# Patient Record
Sex: Female | Born: 1935 | Race: White | Hispanic: No | Smoking: Former smoker
Health system: Southern US, Community
[De-identification: ages and names within clinical notes are randomized; demographics above are authoritative.]

## PROBLEM LIST (undated history)

## (undated) DIAGNOSIS — M1710 Unilateral primary osteoarthritis, unspecified knee: Secondary | ICD-10-CM

## (undated) DIAGNOSIS — I2510 Atherosclerotic heart disease of native coronary artery without angina pectoris: Secondary | ICD-10-CM

## (undated) DIAGNOSIS — G8929 Other chronic pain: Secondary | ICD-10-CM

## (undated) DIAGNOSIS — E782 Mixed hyperlipidemia: Secondary | ICD-10-CM

## (undated) DIAGNOSIS — E119 Type 2 diabetes mellitus without complications: Secondary | ICD-10-CM

## (undated) DIAGNOSIS — R7303 Prediabetes: Secondary | ICD-10-CM

## (undated) DIAGNOSIS — I10 Essential (primary) hypertension: Secondary | ICD-10-CM

## (undated) DIAGNOSIS — M542 Cervicalgia: Secondary | ICD-10-CM

## (undated) DIAGNOSIS — K529 Noninfective gastroenteritis and colitis, unspecified: Secondary | ICD-10-CM

## (undated) DIAGNOSIS — E039 Hypothyroidism, unspecified: Secondary | ICD-10-CM

## (undated) HISTORY — DX: Prediabetes: R73.03

## (undated) HISTORY — DX: Essential (primary) hypertension: I10

## (undated) HISTORY — DX: Noninfective gastroenteritis and colitis, unspecified: K52.9

## (undated) HISTORY — DX: Hypothyroidism, unspecified: E03.9

## (undated) HISTORY — PX: CORONARY ARTERY BYPASS GRAFT: SHX141

## (undated) HISTORY — DX: Unilateral primary osteoarthritis, unspecified knee: M17.10

## (undated) HISTORY — DX: Other chronic pain: G89.29

## (undated) HISTORY — DX: Atherosclerotic heart disease of native coronary artery without angina pectoris: I25.10

## (undated) HISTORY — PX: KNEE ARTHROSCOPY: SUR90

## (undated) HISTORY — DX: Cervicalgia: M54.2

## (undated) HISTORY — DX: Mixed hyperlipidemia: E78.2

---

## 1990-11-01 HISTORY — PX: CERVICAL FUSION: SHX112

## 2014-09-24 DIAGNOSIS — K529 Noninfective gastroenteritis and colitis, unspecified: Secondary | ICD-10-CM | POA: Insufficient documentation

## 2014-09-24 HISTORY — DX: Noninfective gastroenteritis and colitis, unspecified: K52.9

## 2014-10-19 ENCOUNTER — Emergency Department: Payer: Self-pay | Admitting: Emergency Medicine

## 2014-10-19 LAB — URINALYSIS, COMPLETE
BILIRUBIN, UR: NEGATIVE
Blood: NEGATIVE
Glucose,UR: NEGATIVE mg/dL (ref 0–75)
Ketone: NEGATIVE
Nitrite: POSITIVE
Ph: 5 (ref 4.5–8.0)
RBC,UR: 41 /HPF (ref 0–5)
SPECIFIC GRAVITY: 1.023 (ref 1.003–1.030)
Squamous Epithelial: NONE SEEN
WBC UR: 1663 /HPF (ref 0–5)

## 2014-10-19 LAB — COMPREHENSIVE METABOLIC PANEL
ALT: 34 U/L
Albumin: 3.3 g/dL — ABNORMAL LOW (ref 3.4–5.0)
Alkaline Phosphatase: 69 U/L
Anion Gap: 8 (ref 7–16)
BUN: 18 mg/dL (ref 7–18)
Bilirubin,Total: 0.5 mg/dL (ref 0.2–1.0)
CALCIUM: 8.6 mg/dL (ref 8.5–10.1)
CREATININE: 1.29 mg/dL (ref 0.60–1.30)
Chloride: 109 mmol/L — ABNORMAL HIGH (ref 98–107)
Co2: 25 mmol/L (ref 21–32)
EGFR (African American): 51 — ABNORMAL LOW
EGFR (Non-African Amer.): 42 — ABNORMAL LOW
Glucose: 91 mg/dL (ref 65–99)
Osmolality: 285 (ref 275–301)
Potassium: 4.1 mmol/L (ref 3.5–5.1)
SGOT(AST): 37 U/L (ref 15–37)
SODIUM: 142 mmol/L (ref 136–145)
Total Protein: 7.6 g/dL (ref 6.4–8.2)

## 2014-10-19 LAB — CBC WITH DIFFERENTIAL/PLATELET
BASOS ABS: 0.1 10*3/uL (ref 0.0–0.1)
Basophil %: 0.6 %
EOS PCT: 2.2 %
Eosinophil #: 0.2 10*3/uL (ref 0.0–0.7)
HCT: 40.2 % (ref 35.0–47.0)
HGB: 12.7 g/dL (ref 12.0–16.0)
LYMPHS ABS: 3.1 10*3/uL (ref 1.0–3.6)
Lymphocyte %: 31.1 %
MCH: 28.6 pg (ref 26.0–34.0)
MCHC: 31.6 g/dL — AB (ref 32.0–36.0)
MCV: 90 fL (ref 80–100)
Monocyte #: 1 x10 3/mm — ABNORMAL HIGH (ref 0.2–0.9)
Monocyte %: 9.6 %
NEUTROS ABS: 5.7 10*3/uL (ref 1.4–6.5)
Neutrophil %: 56.5 %
Platelet: 221 10*3/uL (ref 150–440)
RBC: 4.44 10*6/uL (ref 3.80–5.20)
RDW: 14.9 % — ABNORMAL HIGH (ref 11.5–14.5)
WBC: 10.1 10*3/uL (ref 3.6–11.0)

## 2014-10-22 LAB — URINE CULTURE

## 2014-10-24 DIAGNOSIS — I10 Essential (primary) hypertension: Secondary | ICD-10-CM

## 2014-10-24 DIAGNOSIS — E782 Mixed hyperlipidemia: Secondary | ICD-10-CM

## 2014-10-24 HISTORY — DX: Mixed hyperlipidemia: E78.2

## 2014-10-24 HISTORY — DX: Essential (primary) hypertension: I10

## 2014-10-29 ENCOUNTER — Ambulatory Visit: Payer: Self-pay | Admitting: Gastroenterology

## 2014-10-29 LAB — CLOSTRIDIUM DIFFICILE(ARMC)

## 2014-11-01 LAB — STOOL CULTURE

## 2015-04-10 DIAGNOSIS — M1710 Unilateral primary osteoarthritis, unspecified knee: Secondary | ICD-10-CM | POA: Insufficient documentation

## 2015-04-10 DIAGNOSIS — M179 Osteoarthritis of knee, unspecified: Secondary | ICD-10-CM | POA: Insufficient documentation

## 2015-04-10 HISTORY — DX: Osteoarthritis of knee, unspecified: M17.9

## 2015-04-10 HISTORY — DX: Unilateral primary osteoarthritis, unspecified knee: M17.10

## 2015-07-21 ENCOUNTER — Other Ambulatory Visit: Payer: Self-pay | Admitting: Unknown Physician Specialty

## 2015-07-21 DIAGNOSIS — M1711 Unilateral primary osteoarthritis, right knee: Secondary | ICD-10-CM

## 2015-07-31 ENCOUNTER — Ambulatory Visit
Admission: RE | Admit: 2015-07-31 | Discharge: 2015-07-31 | Disposition: A | Discharge status: Home / Self Care | Payer: Medicare Other | Source: Ambulatory Visit | Attending: Unknown Physician Specialty | Admitting: Unknown Physician Specialty

## 2015-07-31 DIAGNOSIS — S83281A Other tear of lateral meniscus, current injury, right knee, initial encounter: Secondary | ICD-10-CM | POA: Diagnosis not present

## 2015-07-31 DIAGNOSIS — X58XXXA Exposure to other specified factors, initial encounter: Secondary | ICD-10-CM | POA: Insufficient documentation

## 2015-07-31 DIAGNOSIS — M25461 Effusion, right knee: Secondary | ICD-10-CM | POA: Diagnosis not present

## 2015-07-31 DIAGNOSIS — M1711 Unilateral primary osteoarthritis, right knee: Secondary | ICD-10-CM

## 2015-07-31 DIAGNOSIS — M7121 Synovial cyst of popliteal space [Baker], right knee: Secondary | ICD-10-CM | POA: Insufficient documentation

## 2015-08-18 ENCOUNTER — Other Ambulatory Visit: Payer: Self-pay | Admitting: Physician Assistant

## 2015-08-18 ENCOUNTER — Ambulatory Visit: Payer: Medicare Other | Admitting: Family Medicine

## 2015-08-18 ENCOUNTER — Ambulatory Visit
Admission: RE | Admit: 2015-08-18 | Discharge: 2015-08-18 | Disposition: A | Discharge status: Home / Self Care | Payer: Medicare Other | Source: Ambulatory Visit | Attending: Physician Assistant | Admitting: Physician Assistant

## 2015-08-18 DIAGNOSIS — M7989 Other specified soft tissue disorders: Secondary | ICD-10-CM

## 2015-08-18 DIAGNOSIS — M79662 Pain in left lower leg: Secondary | ICD-10-CM | POA: Diagnosis not present

## 2015-08-18 DIAGNOSIS — M79661 Pain in right lower leg: Secondary | ICD-10-CM

## 2015-08-25 ENCOUNTER — Encounter: Payer: Self-pay | Admitting: *Deleted

## 2015-08-25 DIAGNOSIS — I2510 Atherosclerotic heart disease of native coronary artery without angina pectoris: Secondary | ICD-10-CM

## 2015-08-25 DIAGNOSIS — R7303 Prediabetes: Secondary | ICD-10-CM

## 2015-08-25 DIAGNOSIS — G8929 Other chronic pain: Secondary | ICD-10-CM | POA: Insufficient documentation

## 2015-08-25 DIAGNOSIS — E039 Hypothyroidism, unspecified: Secondary | ICD-10-CM

## 2015-08-25 DIAGNOSIS — M542 Cervicalgia: Secondary | ICD-10-CM

## 2015-08-25 HISTORY — DX: Atherosclerotic heart disease of native coronary artery without angina pectoris: I25.10

## 2015-08-25 HISTORY — DX: Hypothyroidism, unspecified: E03.9

## 2015-08-25 HISTORY — DX: Prediabetes: R73.03

## 2015-08-25 HISTORY — DX: Other chronic pain: G89.29

## 2015-08-26 ENCOUNTER — Encounter: Payer: Self-pay | Admitting: Obstetrics and Gynecology

## 2015-08-26 ENCOUNTER — Ambulatory Visit (INDEPENDENT_AMBULATORY_CARE_PROVIDER_SITE_OTHER): Payer: Medicare Other | Admitting: Obstetrics and Gynecology

## 2015-08-26 VITALS — BP 112/70 | HR 92 | Resp 16 | Ht 65.0 in | Wt 216.6 lb

## 2015-08-26 DIAGNOSIS — R32 Unspecified urinary incontinence: Secondary | ICD-10-CM

## 2015-08-26 LAB — URINALYSIS, COMPLETE
Bilirubin, UA: NEGATIVE
Glucose, UA: NEGATIVE
Ketones, UA: NEGATIVE
NITRITE UA: NEGATIVE
PH UA: 5.5 (ref 5.0–7.5)
Specific Gravity, UA: 1.015 (ref 1.005–1.030)
Urobilinogen, Ur: 0.2 mg/dL (ref 0.2–1.0)

## 2015-08-26 LAB — MICROSCOPIC EXAMINATION
EPITHELIAL CELLS (NON RENAL): NONE SEEN /HPF (ref 0–10)
RENAL EPITHEL UA: NONE SEEN /HPF
WBC, UA: >30 /hpf — AB (ref 0–?)

## 2015-08-26 LAB — BLADDER SCAN AMB NON-IMAGING: Scan Result: 85

## 2015-08-26 MED ORDER — ESTROGENS, CONJUGATED 0.625 MG/GM VA CREA: TOPICAL_CREAM | VAGINAL | Status: AC

## 2015-08-26 NOTE — Progress Notes (Signed)
08/26/2015 1:13 PM   Claire Cortez January 21, 1934 [id number]  Referring provider: Marisue Ivan, MD [phone] [facility] [address] [facility] [place], [place] [address]  Chief Complaint  Patient presents with  . Urinary Incontinence  . Establish Care    HPI: Patient is a 81 year old female presenting today with complaints of weak stream, frequent small volume voids, nocturia 4 times per night, sensation of incomplete bladder emptying and urge incontinence. Symptoms have been ongoing for the last 5 years. She also reports recurrent urinary tract infections. No dysuria or hematuria.  Does reports symptoms of vaginal dryness and odor.   No previous GU/Gyn surgeries.   H2O intake 40ozs daily. 1 cup decaf coffee per day, 1 soda per day.   PMH: Past Medical History  Diagnosis Date  . Arteriosclerosis of coronary artery 08/25/2015    Overview:  Sp cabg x3 with graft stent 2007   . Benign essential HTN 10/24/2014  . Chronic diarrhea 09/24/2014  . Acquired hypothyroidism 08/25/2015  . Borderline diabetes mellitus 08/25/2015  . Arthritis of knee, degenerative 04/10/2015  . Chronic cervical pain 08/25/2015  . Combined fat and carbohydrate induced hyperlipemia 10/24/2014    Surgical History: Past Surgical History  Procedure Laterality Date  . Cervical fusion  1992  . Knee arthroscopy    . Coronary artery bypass graft      x3    Home Medications:    Medication List       This list is accurate as of: 08/26/15  1:13 PM.  Always use your most recent med list.               amitriptyline 25 MG tablet  Commonly known as:  ELAVIL  Take by mouth.     aspirin EC 81 MG tablet  Take by mouth.     CO ENZYME Q-10 PO  Take 200 mg by mouth.     conjugated estrogens vaginal cream  Commonly known as:  PREMARIN  Apply blueberry sized amount to vaginal opening with finger tip M-W-F nights.     CRESTOR 10 MG tablet  Generic drug:  rosuvastatin  TAKE 1 TABLET NIGHTLY     Fish Oil 1200 MG Caps  Take by mouth.     levothyroxine 88 MCG tablet  Commonly known as:  SYNTHROID, LEVOTHROID  TAKE 1 TABLET DAILY ON AN EMPTY STOMACH WITH A GLASS OF WATER AT LEAST 30 TO 60 MINUTES BEFORE BREAKFAST     lisinopril 10 MG tablet  Commonly known as:  PRINIVIL,ZESTRIL  TAKE 1 TABLET DAILY     MULTI-VITAMINS Tabs  Take by mouth.     SM GLUCOSAMINE HCL 1500 MG Tabs  Generic drug:  Glucosamine HCl  Take by mouth.     traMADol 50 MG tablet  Commonly known as:  ULTRAM  Take by mouth.        Allergies:  Allergies  Allergen Reactions  . Emetrol Nausea And Vomiting  . Lopressor  [Metoprolol Tartrate]     Other reaction(s): Unknown intolerance  . Nsaids     Other reaction(s): Other (See Comments) Causes elevation in BP  . Sulfa Antibiotics Rash    Family History: Family History  Problem Relation Age of Onset  . Kidney cancer Neg Hx   . Kidney disease Neg Hx   . Prostate cancer Neg Hx     Social History:  reports that she quit smoking about 41 years ago. Her smoking use included Cigarettes. She does not have any  smokeless tobacco history on file. She reports that she drinks about 3.6 oz of alcohol per week. She reports that she does not use illicit drugs.  ROS: UROLOGY Frequent Urination?: No Hard to postpone urination?: Yes Burning/pain with urination?: No Get up at night to urinate?: Yes Leakage of urine?: Yes Urine stream starts and stops?: No Trouble starting stream?: No Do you have to strain to urinate?: No Blood in urine?: No Urinary tract infection?: Yes Sexually transmitted disease?: No Injury to kidneys or bladder?: No Painful intercourse?: No Weak stream?: No Currently pregnant?: No Vaginal bleeding?: No Last menstrual period?: 1983  Gastrointestinal Nausea?: No Vomiting?: No Indigestion/heartburn?: No Diarrhea?: No Constipation?: No  Constitutional Fever: No Night sweats?: No Weight loss?: No Fatigue?: Yes  Skin Skin  rash/lesions?: Yes Itching?: Yes  Eyes Blurred vision?: Yes Double vision?: No  Ears/Nose/Throat Sore throat?: No Sinus problems?: No  Hematologic/Lymphatic Swollen glands?: No Easy bruising?: Yes  Cardiovascular Leg swelling?: Yes Chest pain?: No  Respiratory Cough?: Yes Shortness of breath?: Yes  Endocrine Excessive thirst?: No  Musculoskeletal Back pain?: No Joint pain?: Yes  Neurological Headaches?: No Dizziness?: Yes  Psychologic Depression?: No Anxiety?: No  Physical Exam: BP 112/70 mmHg  Pulse 92  Resp 16  Ht 5\' 5"  (1.651 m)  Wt 216 lb 9.6 oz (98.249 kg)  BMI 36.04 kg/m2  Constitutional:  Alert and oriented, No acute distress. HEENT: [place] AT, moist mucus membranes.  Trachea midline, no masses. Cardiovascular: No clubbing, cyanosis, or edema. Respiratory: Normal respiratory effort, no increased work of breathing. GI: Abdomen is soft, nontender, nondistended, no abdominal masses GU: No CVA tenderness.  Pelvic: External:no rashes or lesions, atrophic vaginal mucosa, no cystocele or SUI with valsalva Skin: No rashes, bruises or suspicious lesions. Lymph: No cervical or inguinal adenopathy. Neurologic: Grossly intact, no focal deficits, moving all 4 extremities. Psychiatric: Normal mood and affect.  Laboratory Data:   Urinalysis Results for orders placed or performed in visit on 08/26/15  BLADDER SCAN AMB NON-IMAGING  Result Value Ref Range   Scan Result 85 mL     Pertinent Imaging:  Assessment & Plan:   1. OAB- PVR 85mL. Two weeks of samples provided of Vesicare 5mg .   -RTC for recheck and PVR. - Urinalysis, Complete  2. Vaginal Atrophy- Prescription written for Premarin vaginal estrogen cream.  3. Recurrent UTI-  UTI prevention strategies discussed.  Good perineal hygiene reviewed. Patient is encouraged to increase daily water intake, start cranberry supplements to prevent invasive colonization along the urinary tract and probiotics,  especially lactobacillus to restore normal vaginal flora.  UA suspicious for infection.  Sent for culture. Will treat pending culture results.   Return in about 2 weeks (around 09/09/2015) for recheck incontinence/PVR.  These notes generated with voice recognition software. I apologize for typographical errors.  Earlie LouLindsay Phillis Thackeray, FNP  [facility] [address], [address] [place], [place] [address] [phone]

## 2015-08-26 NOTE — Patient Instructions (Signed)

## 2015-08-28 LAB — CULTURE, URINE COMPREHENSIVE

## 2015-08-29 ENCOUNTER — Telehealth: Payer: Self-pay

## 2015-08-29 NOTE — Telephone Encounter (Signed)
Spoke with pt in reference to ucx and micro heme. Pt voiced understanding.

## 2015-08-29 NOTE — Telephone Encounter (Signed)
-----   Message from Fernanda DrumLindsay C Overton, FNP sent at 08/29/2015  3:41 PM EDT ----- Please notify patient that her urine culture was negative for infection. Her UA at her last visit did however show microscopic hematuria. We need to recheck this at her follow-up visit and possibly pursue further workup. I will talk to her about this further at her follow-up visit. Thanks

## 2015-09-09 ENCOUNTER — Ambulatory Visit (INDEPENDENT_AMBULATORY_CARE_PROVIDER_SITE_OTHER): Payer: Medicare Other | Admitting: Obstetrics and Gynecology

## 2015-09-09 ENCOUNTER — Encounter: Payer: Self-pay | Admitting: Obstetrics and Gynecology

## 2015-09-09 VITALS — BP 113/74 | HR 94 | Resp 18 | Ht 65.0 in | Wt 216.8 lb

## 2015-09-09 DIAGNOSIS — R32 Unspecified urinary incontinence: Secondary | ICD-10-CM | POA: Diagnosis not present

## 2015-09-09 LAB — BLADDER SCAN AMB NON-IMAGING

## 2015-09-09 LAB — URINALYSIS, COMPLETE
Bilirubin, UA: NEGATIVE
GLUCOSE, UA: NEGATIVE
KETONES UA: NEGATIVE
NITRITE UA: NEGATIVE
SPEC GRAV UA: 1.02 (ref 1.005–1.030)
UUROB: 0.2 mg/dL (ref 0.2–1.0)
pH, UA: 5.5 (ref 5.0–7.5)

## 2015-09-09 LAB — MICROSCOPIC EXAMINATION: RBC MICROSCOPIC, UA: NONE SEEN /HPF (ref 0–?)

## 2015-09-09 MED ORDER — SOLIFENACIN SUCCINATE 5 MG PO TABS: 5.0000 mg | ORAL_TABLET | Freq: Every day | ORAL | Status: AC

## 2015-09-09 NOTE — Progress Notes (Signed)
09/09/2015 11:00 AM   Claire Cortez [birth year] [id number]  Referring provider: Marisue Ivan, MD [phone] [facility] [address] [facility] [place], [place] [address]  No chief complaint on file.   HPI: Patient is a 81 year old female presenting today with complaints of weak stream, frequent small volume voids, nocturia 4 times per night, sensation of incomplete bladder emptying and urge incontinence. Symptoms have been ongoing for the last 5 years. She also reports recurrent urinary tract infections. No dysuria or hematuria.  Does reports symptoms of vaginal dryness and odor.   No previous GU/Gyn surgeries.   H2O intake 40ozs daily. 1 cup decaf coffee per day, 1 soda per day.  Interval History: Patient reports improvement improvement in symptoms.  She states she is experiencing less frequency and volume of each void has increased.  She feels that she is able to completely emptying her bladder.  She has not started her vaginal estrogen cream because she is concerned about potential side effects. She has experienced some dry mouth but is not extremely bothered by this. She would like to continue the Vesicare for now.   PMH: Past Medical History  Diagnosis Date  . Arteriosclerosis of coronary artery 08/25/2015    Overview:  Sp cabg x3 with graft stent 2007   . Benign essential HTN 10/24/2014  . Chronic diarrhea 09/24/2014  . Acquired hypothyroidism 08/25/2015  . Borderline diabetes mellitus 08/25/2015  . Arthritis of knee, degenerative 04/10/2015  . Chronic cervical pain 08/25/2015  . Combined fat and carbohydrate induced hyperlipemia 10/24/2014    Surgical History: Past Surgical History  Procedure Laterality Date  . Cervical fusion  1992  . Knee arthroscopy    . Coronary artery bypass graft      x3    Home Medications:    Medication List       This list is accurate as of: 09/09/15 11:00 AM.  Always use your most recent med list.               amitriptyline  25 MG tablet  Commonly known as:  ELAVIL  Take by mouth.     aspirin EC 81 MG tablet  Take by mouth.     CO ENZYME Q-10 PO  Take 200 mg by mouth.     conjugated estrogens vaginal cream  Commonly known as:  PREMARIN  Apply blueberry sized amount to vaginal opening with finger tip M-W-F nights.     CRESTOR 10 MG tablet  Generic drug:  rosuvastatin  TAKE 1 TABLET NIGHTLY     Fish Oil 1200 MG Caps  Take by mouth.     levothyroxine 88 MCG tablet  Commonly known as:  SYNTHROID, LEVOTHROID  TAKE 1 TABLET DAILY ON AN EMPTY STOMACH WITH A GLASS OF WATER AT LEAST 30 TO 60 MINUTES BEFORE BREAKFAST     lisinopril 10 MG tablet  Commonly known as:  PRINIVIL,ZESTRIL  TAKE 1 TABLET DAILY     MULTI-VITAMINS Tabs  Take by mouth.     SM GLUCOSAMINE HCL 1500 MG Tabs  Generic drug:  Glucosamine HCl  Take by mouth.     solifenacin 5 MG tablet  Commonly known as:  VESICARE  Take 1 tablet (5 mg total) by mouth daily.     traMADol 50 MG tablet  Commonly known as:  ULTRAM  Take by mouth.        Allergies:  Allergies  Allergen Reactions  . Emetrol Nausea And Vomiting  . Lopressor  [Metoprolol  Tartrate]     Other reaction(s): Unknown intolerance  . Nsaids     Other reaction(s): Other (See Comments) Causes elevation in BP  . Sulfa Antibiotics Rash    Family History: Family History  Problem Relation Age of Onset  . Kidney cancer Neg Hx   . Kidney disease Neg Hx   . Prostate cancer Neg Hx     Social History:  reports that she quit smoking about 41 years ago. Her smoking use included Cigarettes. She does not have any smokeless tobacco history on file. She reports that she drinks about 3.6 oz of alcohol per week. She reports that she does not use illicit drugs.  ROS: UROLOGY Frequent Urination?: Yes Hard to postpone urination?: Yes Burning/pain with urination?: No Get up at night to urinate?: Yes Leakage of urine?: Yes Urine stream starts and stops?: No Trouble starting  stream?: No Do you have to strain to urinate?: No Blood in urine?: No Urinary tract infection?: No Sexually transmitted disease?: No Injury to kidneys or bladder?: No Painful intercourse?: No Weak stream?: Yes Currently pregnant?: No Vaginal bleeding?: No Last menstrual period?: n  Gastrointestinal Nausea?: No Vomiting?: No Indigestion/heartburn?: No Diarrhea?: No Constipation?: No  Constitutional Fever: No Night sweats?: No Weight loss?: No Fatigue?: No  Skin Skin rash/lesions?: Yes Itching?: No  Eyes Blurred vision?: No Double vision?: No  Ears/Nose/Throat Sore throat?: No Sinus problems?: No  Hematologic/Lymphatic Swollen glands?: No Easy bruising?: Yes  Cardiovascular Leg swelling?: Yes Chest pain?: No  Respiratory Cough?: Yes Shortness of breath?: Yes  Endocrine Excessive thirst?: No  Musculoskeletal Back pain?: No Joint pain?: Yes  Neurological Headaches?: No Dizziness?: No  Psychologic Depression?: No Anxiety?: No  Physical Exam: BP 113/74 mmHg  Pulse 94  Resp 18  Ht  (1.651 m)  Wt 216 lb 12.8 oz (98.34 kg)  BMI 36.08 kg/m2  Constitutional:  Alert and oriented, No acute distress. HEENT: [place] AT, moist mucus membranes.  Trachea midline, no masses. Cardiovascular: No clubbing, cyanosis, or edema. Respiratory: Normal respiratory effort, no increased work of breathing. Skin: No rashes, bruises or suspicious lesions. Lymph: No cervical or inguinal adenopathy. Neurologic: Grossly intact, no focal deficits, moving all 4 extremities. Psychiatric: Normal mood and affect.  Laboratory Data:   Urinalysis    Component Value Date/Time   COLORURINE Amber 10/19/2014 1430   APPEARANCEUR Turbid 10/19/2014 1430   LABSPEC 1.023 10/19/2014 1430   PHURINE 5.0 10/19/2014 1430   GLUCOSEU Negative 08/26/2015 0922   GLUCOSEU Negative 10/19/2014 1430   HGBUR Negative 10/19/2014 1430   BILIRUBINUR Negative 08/26/2015 0922   BILIRUBINUR  Negative 10/19/2014 1430   KETONESUR Negative 10/19/2014 1430   PROTEINUR 30 mg/dL 16/08/9603 5409   NITRITE Negative 08/26/2015 0922   NITRITE Positive 10/19/2014 1430   LEUKOCYTESUR 3+* 08/26/2015 0922   LEUKOCYTESUR 3+ 10/19/2014 1430    Pertinent Imaging:   Assessment & Plan:    1. OAB- PVR 32mL. Patient reports improvement since starting Vesicare. She has experienced some dry mouth but states it's not particularly bothersome to her. -RTC 3 months - Urinalysis, Complete  2. Vaginal Atrophy- Prescription written for Premarin vaginal estrogen cream.  Patient has not yet started using cream because she was concerned about potential side effects. General risks of HRT reviewed though I explained to the patient that vaginally administered estrogen, which causes only a slight increase in the blood estrogen levels, have fewer contraindications and adverse systemic effects that oral HT. Patient states that she may start using  it.  3. Recurrent UTI- UTI prevention strategies discussed. Good perineal hygiene reviewed. Patient is encouraged to increase daily water intake, start cranberry supplements to prevent invasive colonization along the urinary tract and probiotics, especially lactobacillus to restore normal vaginal flora. UA suspicious for infection. Sent for culture. Will treat pending culture results.  4.  Microscopic Hematuria- Recheck today.  We discussed the differential diagnosis for microscopic hematuria including nephrolithiasis, renal or upper tract tumors, bladder stones, UTIs, or bladder tumors as well as undetermined etiologies. Per AUA guidelines, I did recommend complete microscopic hematuria evaluation including CTU, possible urine cytology, and office cystoscopy. Patient states understanding but declined hematuria work up unless microscopic hematuria reoccurs. We will continue to monitor UAs.  No RBCs seen on today's UA.  Return in about 3 months (around 12/10/2015) for  recheck OAB; med check Vesicare;Marland Kitchen.  These notes generated with voice recognition software. I apologize for typographical errors.  Earlie LouLindsay Geniya Fulgham, FNP  [facility] [address], [address] [place], [place] [address] [phone]

## 2015-09-23 ENCOUNTER — Other Ambulatory Visit: Payer: Self-pay

## 2015-09-23 ENCOUNTER — Telehealth: Payer: Self-pay

## 2015-09-23 NOTE — Telephone Encounter (Signed)
Pt pharmacy sent a refill request for tramadol. Please advise.

## 2015-09-23 NOTE — Telephone Encounter (Signed)
Pt called wanting to know u/a results from 09/09/15. Read pt u/a that was in computer. Also read pt your dictation. Pt states that shes confused and does not know if we need to continue with hematuria work up. Please advise.

## 2015-09-23 NOTE — Telephone Encounter (Signed)
When I reviewed the chart it seems that Brett CanalesSteve rote that prescription and it was not documented why the patient needed a prescription for tramadol. If he is having continued pain she needs to come in and be seen. I did not provide refills for pain medications. As far as her microscopic hematuria is concerned her last check was negative and we spoke about this at her last visit per my documentation.

## 2015-09-24 NOTE — Telephone Encounter (Signed)
I do not see a follow-up appointment for this patient on file we please have her make one if she is interested in further treatment. Thanks

## 2015-09-29 NOTE — Telephone Encounter (Signed)
Spoke with pt in reference to tramadol. Pt stated she received medication from her knee dr.

## 2016-02-18 ENCOUNTER — Other Ambulatory Visit: Payer: Self-pay | Admitting: Unknown Physician Specialty

## 2016-02-18 DIAGNOSIS — M1711 Unilateral primary osteoarthritis, right knee: Secondary | ICD-10-CM

## 2016-03-02 ENCOUNTER — Ambulatory Visit
Admission: RE | Admit: 2016-03-02 | Discharge: 2016-03-02 | Disposition: A | Discharge status: Home / Self Care | Payer: Medicare Other | Source: Ambulatory Visit | Attending: Unknown Physician Specialty | Admitting: Unknown Physician Specialty

## 2016-03-02 DIAGNOSIS — Z0189 Encounter for other specified special examinations: Secondary | ICD-10-CM | POA: Diagnosis not present

## 2016-03-02 DIAGNOSIS — M1711 Unilateral primary osteoarthritis, right knee: Secondary | ICD-10-CM

## 2016-09-02 ENCOUNTER — Emergency Department
Admission: EM | Admit: 2016-09-02 | Discharge: 2016-09-02 | Disposition: A | Discharge status: Home / Self Care | Payer: Medicare Other | Attending: Emergency Medicine | Admitting: Emergency Medicine

## 2016-09-02 ENCOUNTER — Encounter: Payer: Self-pay | Admitting: Emergency Medicine

## 2016-09-02 DIAGNOSIS — Z951 Presence of aortocoronary bypass graft: Secondary | ICD-10-CM | POA: Diagnosis not present

## 2016-09-02 DIAGNOSIS — Z7982 Long term (current) use of aspirin: Secondary | ICD-10-CM | POA: Diagnosis not present

## 2016-09-02 DIAGNOSIS — Z87891 Personal history of nicotine dependence: Secondary | ICD-10-CM | POA: Insufficient documentation

## 2016-09-02 DIAGNOSIS — I10 Essential (primary) hypertension: Secondary | ICD-10-CM

## 2016-09-02 DIAGNOSIS — Z79899 Other long term (current) drug therapy: Secondary | ICD-10-CM | POA: Diagnosis not present

## 2016-09-02 NOTE — ED Provider Notes (Signed)
[facility] Emergency Department Provider Note  ____________________________________________   First MD Initiated Contact with Patient 09/02/16 1849     (approximate)  I have reviewed the triage vital signs and the nursing notes.   HISTORY  Chief Complaint Hypertension   HPI Claire Cortez is a 82 y.o. female resented with 1 day of hypertension and a feeling of "fullness" to her head. She says that she has been taking steroids now for 2 days. She took 60 mg yesterday and 50 mg today. Soon after starting the steroids she began to have the awkward feeling in her head which she describes a fullness but not pain or headache. She says that she had a similar reaction with Aleve one year ago and does not take this medication anymore. She was taking steroids for cramping and pain in her feet that was prescribed by her primary care doctor. She denies any weakness or numbness.   Past Medical History:  Diagnosis Date  . Acquired hypothyroidism 08/25/2015  . Arteriosclerosis of coronary artery 08/25/2015   Overview:  Sp cabg x3 with graft stent 2007   . Arthritis of knee, degenerative 04/10/2015  . Benign essential HTN 10/24/2014  . Borderline diabetes mellitus 08/25/2015  . Chronic cervical pain 08/25/2015  . Chronic diarrhea 09/24/2014  . Combined fat and carbohydrate induced hyperlipemia 10/24/2014    Patient Active Problem List   Diagnosis Date Noted  . Acquired hypothyroidism 08/25/2015  . Borderline diabetes mellitus 08/25/2015  . Chronic cervical pain 08/25/2015  . Arteriosclerosis of coronary artery 08/25/2015  . Arthritis of knee, degenerative 04/10/2015  . Benign essential HTN 10/24/2014  . Combined fat and carbohydrate induced hyperlipemia 10/24/2014  . Chronic diarrhea 09/24/2014    Past Surgical History:  Procedure Laterality Date  . CERVICAL FUSION  1992  . CORONARY ARTERY BYPASS GRAFT     x3  . KNEE ARTHROSCOPY      Prior to  Admission medications   Medication Sig Start Date End Date Taking? Authorizing Provider  amitriptyline (ELAVIL) 25 MG tablet Take by mouth. 01/21/15 01/21/16  Historical Provider, MD  aspirin EC 81 MG tablet Take by mouth.    Historical Provider, MD  CO ENZYME Q-10 PO Take 200 mg by mouth.    Historical Provider, MD  conjugated estrogens (PREMARIN) vaginal cream Apply blueberry sized amount to vaginal opening with finger tip M-W-F nights. 08/26/15   Fernanda Drum, FNP  Glucosamine HCl (SM GLUCOSAMINE HCL) 1500 MG TABS Take by mouth.    Historical Provider, MD  levothyroxine (SYNTHROID, LEVOTHROID) 88 MCG tablet TAKE 1 TABLET DAILY ON AN EMPTY STOMACH WITH A GLASS OF WATER AT LEAST 30 TO 60 MINUTES BEFORE BREAKFAST 07/01/15   Historical Provider, MD  lisinopril (PRINIVIL,ZESTRIL) 10 MG tablet TAKE 1 TABLET DAILY 03/14/15   Historical Provider, MD  Multiple Vitamin (MULTI-VITAMINS) TABS Take by mouth.    Historical Provider, MD  Omega-3 Fatty Acids (FISH OIL) 1200 MG CAPS Take by mouth.    Historical Provider, MD  rosuvastatin (CRESTOR) 10 MG tablet TAKE 1 TABLET NIGHTLY 08/12/15   Historical Provider, MD  solifenacin (VESICARE) 5 MG tablet Take 1 tablet (5 mg total) by mouth daily. 09/09/15   Fernanda Drum, FNP  traMADol (ULTRAM) 50 MG tablet Take by mouth. 06/18/15   Historical Provider, MD    Allergies Demerol [meperidine]; Emetrol; Lopressor  [metoprolol tartrate]; Nsaids; and Sulfa antibiotics  Family History  Problem Relation Age of Onset  . Kidney cancer Neg  Hx   . Kidney disease Neg Hx   . Prostate cancer Neg Hx     Social History Social History  Substance Use Topics  . Smoking status: Former Smoker    Types: Cigarettes    Quit date: 08/25/1974  . Smokeless tobacco: Not on file  . Alcohol use 3.6 oz/week    6 Glasses of wine per week    Review of Systems Constitutional: No fever/chills Eyes: No visual changes. ENT: No sore throat. Cardiovascular: Denies chest  pain. Respiratory: Denies shortness of breath. Gastrointestinal: No abdominal pain.  No nausea, no vomiting.  No diarrhea.  No constipation. Genitourinary: Negative for dysuria. Musculoskeletal: Negative for back pain. Skin: Negative for rash. Neurological: Negative for headaches, focal weakness or numbness.  10-point ROS otherwise negative.  ____________________________________________   PHYSICAL EXAM:  VITAL SIGNS: ED Triage Vitals [09/02/16 1826]  Enc Vitals Group     BP (!) 176/95     Pulse Rate (!) 111     Resp (!) 22     Temp 98.9 F (37.2 C)     Temp Source Oral     SpO2 94 %     Weight 220 lb (99.8 kg)     Height 5\' 4"  (1.626 m)     Head Circumference      Peak Flow      Pain Score 0     Pain Loc      Pain Edu?      Excl. in GC?     Constitutional: Alert and oriented. Well appearing and in no acute distress. Eyes: Conjunctivae are normal. PERRL. EOMI. Head: Atraumatic. Nose: No congestion/rhinnorhea. Mouth/Throat: Mucous membranes are moist.   Neck: No stridor.   Cardiovascular: Normal rate, regular rhythm. Grossly normal heart sounds.  Intact, bilateral nuchal dorsalis pedis as well as radial pulses. Respiratory: Normal respiratory effort.  No retractions. Lungs CTAB. Gastrointestinal: Soft and nontender. No distention. No abdominal bruits. No CVA tenderness. Musculoskeletal: No lower extremity tenderness nor edema.  No joint effusions. Neurologic:  Normal speech and language. No gross focal neurologic deficits are appreciated. Skin:  Skin is warm, dry and intact. No rash noted. Psychiatric: Mood and affect are normal. Speech and behavior are normal.  ____________________________________________   LABS (all labs ordered are listed, but only abnormal results are displayed)  Labs Reviewed - No data to display ____________________________________________  EKG  ED ECG REPORT I, Alexandre Faries,  Teena Iraniavid M, the attending physician, personally viewed and  interpreted this ECG.   Date: 09/02/2016  EKG Time: 1846  Rate: 94  Rhythm: normal sinus rhythm  Axis: normal  Intervals: Prolonged PR interval.  ST&T Change: No ST segment elevation or depression. No abnormal T-wave inversion.  ____________________________________________  RADIOLOGY   ____________________________________________   PROCEDURES  Procedure(s) performed:   Procedures  Critical Care performed:   ____________________________________________   INITIAL IMPRESSION / ASSESSMENT AND PLAN / ED COURSE  Pertinent labs & imaging results that were available during my care of the patient were reviewed by me and considered in my medical decision making (see chart for details).  Patient's vitals have normalized. A symptomatic at the time of the evaluation. Recommended that the patient discontinue her steroids. She is understanding of this plan. She'll follow with her primary care doctor. Likely side effect of the medication causing her symptoms today.  Clinical Course     ____________________________________________   FINAL CLINICAL IMPRESSION(S) / ED DIAGNOSES  Final diagnoses:  Hypertension, unspecified type  NEW MEDICATIONS STARTED DURING THIS VISIT:  New Prescriptions   No medications on file     Note:  This document was prepared using Dragon voice recognition software and may include unintentional dictation errors.    Myrna Blazeravid Matthew Sung [place], MD 09/02/16 2037

## 2016-09-02 NOTE — ED Triage Notes (Signed)
Pt reports being on prednisone for inflammation of her feet, reports BP is 169/97 and has a bad headache. Pt reports head pressure this morning and dizziness.

## 2016-12-10 ENCOUNTER — Other Ambulatory Visit: Payer: Self-pay | Admitting: Specialist

## 2016-12-10 DIAGNOSIS — R0602 Shortness of breath: Secondary | ICD-10-CM

## 2016-12-10 DIAGNOSIS — R942 Abnormal results of pulmonary function studies: Secondary | ICD-10-CM

## 2016-12-21 ENCOUNTER — Ambulatory Visit
Admission: RE | Admit: 2016-12-21 | Discharge: 2016-12-21 | Disposition: A | Discharge status: Home / Self Care | Payer: Medicare Other | Source: Ambulatory Visit | Attending: Specialist | Admitting: Specialist

## 2016-12-21 DIAGNOSIS — R942 Abnormal results of pulmonary function studies: Secondary | ICD-10-CM | POA: Diagnosis not present

## 2016-12-21 DIAGNOSIS — R918 Other nonspecific abnormal finding of lung field: Secondary | ICD-10-CM | POA: Diagnosis not present

## 2016-12-21 DIAGNOSIS — J986 Disorders of diaphragm: Secondary | ICD-10-CM | POA: Insufficient documentation

## 2016-12-21 DIAGNOSIS — J9801 Acute bronchospasm: Secondary | ICD-10-CM | POA: Diagnosis not present

## 2016-12-21 DIAGNOSIS — R0602 Shortness of breath: Secondary | ICD-10-CM | POA: Diagnosis not present

## 2016-12-21 DIAGNOSIS — J439 Emphysema, unspecified: Secondary | ICD-10-CM | POA: Diagnosis not present

## 2016-12-21 DIAGNOSIS — D71 Functional disorders of polymorphonuclear neutrophils: Secondary | ICD-10-CM | POA: Insufficient documentation

## 2017-04-05 IMAGING — MR MR KNEE*R* W/O CM
6 series · 40 of 40 positions shown · non-contrast
Comparison: None.

CLINICAL DATA: Right knee pain lateral area of knee with some
swelling at times.

EXAM:
MRI OF THE RIGHT KNEE WITHOUT CONTRAST
TECHNIQUE: Multiplanar, multisequence MR imaging of the knee was performed. No
intravenous contrast was administered.

[Series 3: PD fat-sat · axial · 3.0mm · 0.31mm/px · z∈[-90,+22]mm · 8 of 35 slices shown (1 of 4)]
[im 1/35]
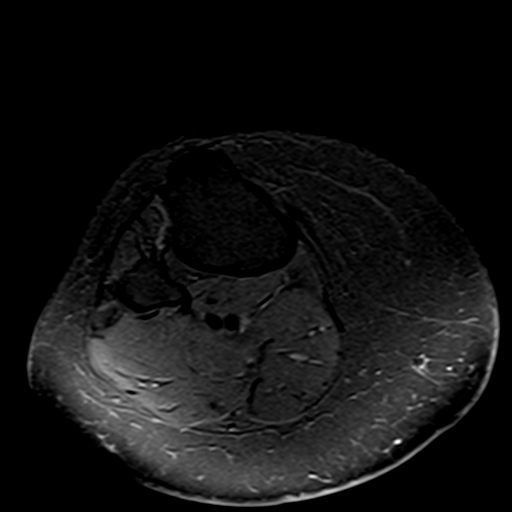
[im 5/35]
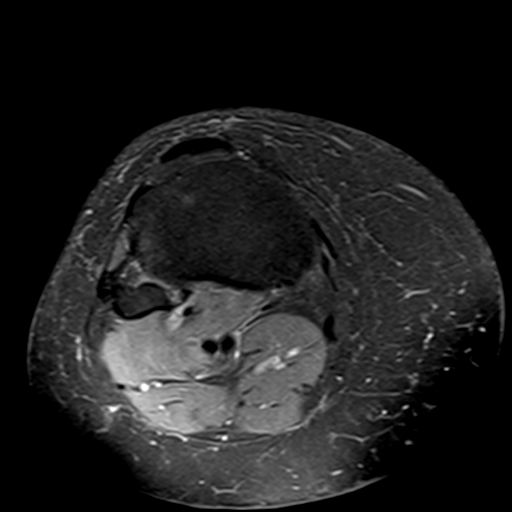
[im 10/35]
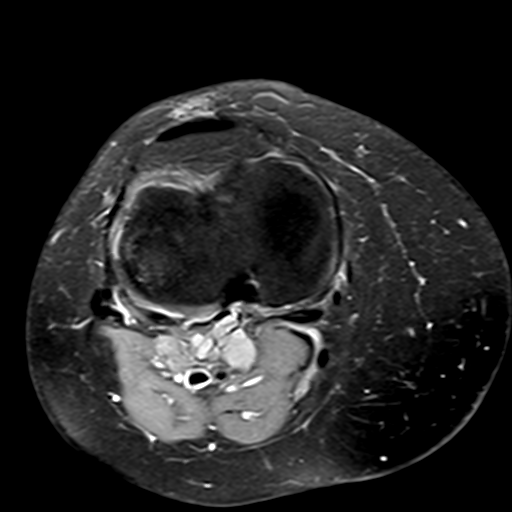
[im 15/35]
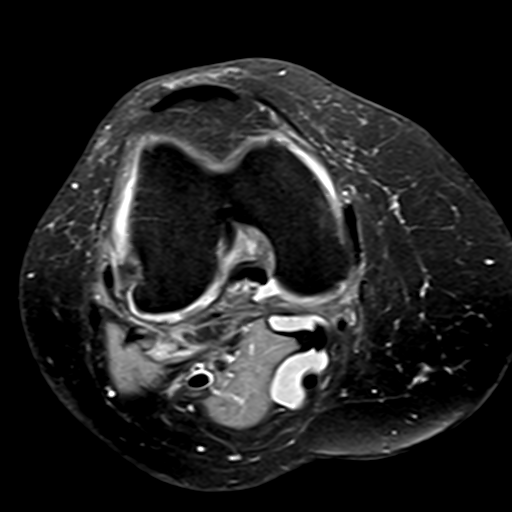
[im 20/35]
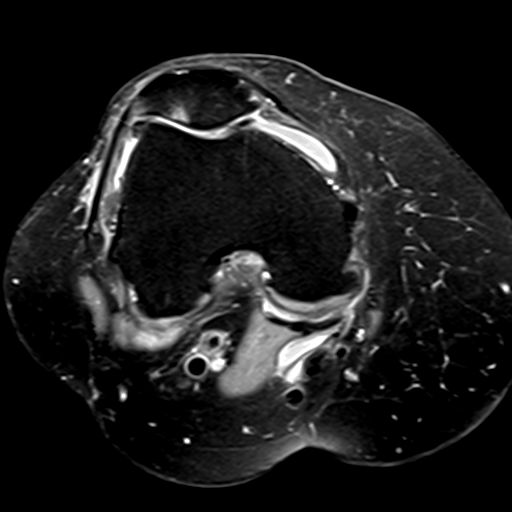
[im 25/35]
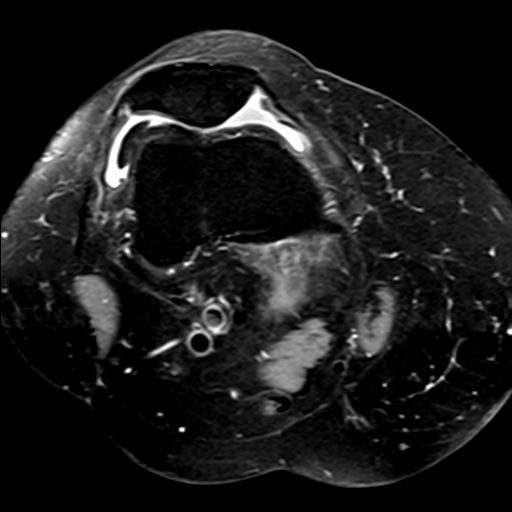
[im 30/35]
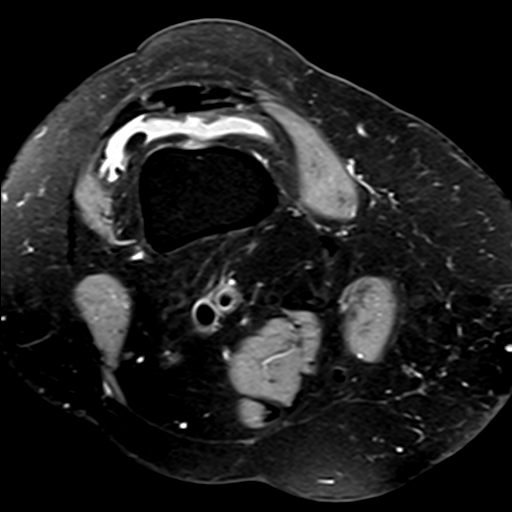
[im 35/35]
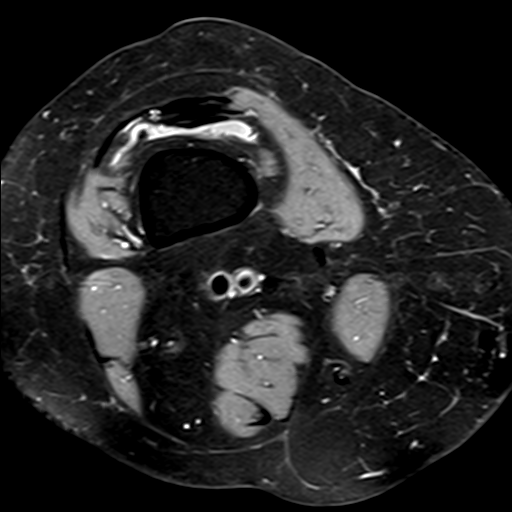

[Series 4: T1 · coronal · 3.0mm · 0.62mm/px · 7 of 30 slices shown]
[im 1/30]
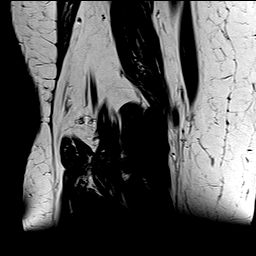
[im 5/30]
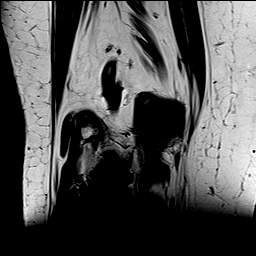
[im 10/30]
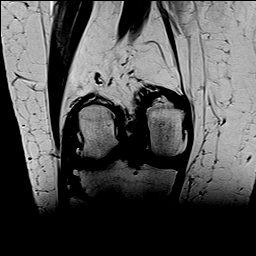
[im 15/30]
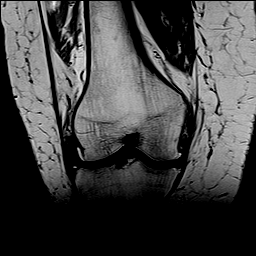
[im 20/30]
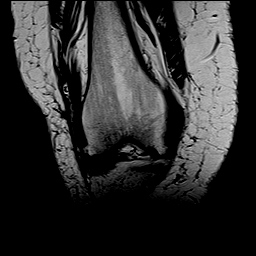
[im 25/30]
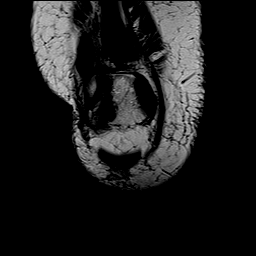
[im 30/30]
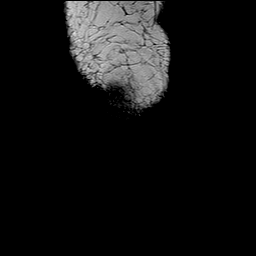

[Series 5: T2 fat-sat · coronal · 3.0mm · 0.50mm/px · 7 of 30 slices shown]
[im 1/30]
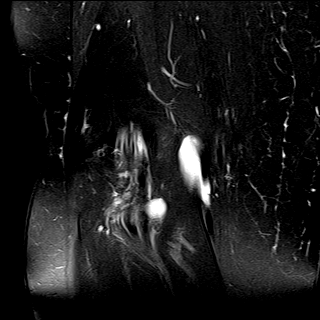
[im 5/30]
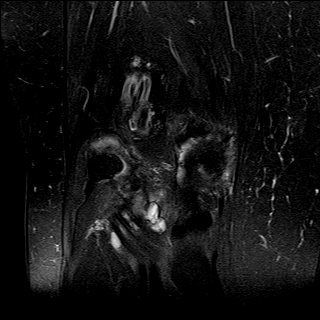
[im 10/30]
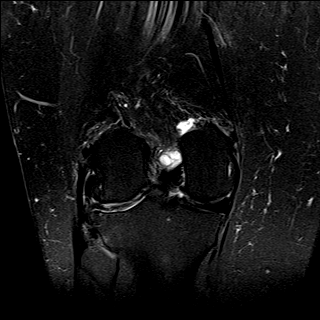
[im 15/30]
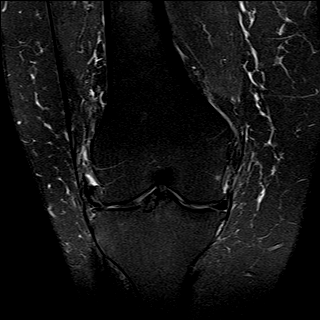
[im 20/30]
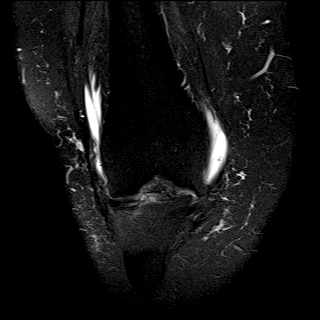
[im 25/30]
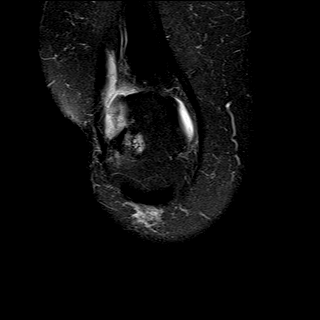
[im 30/30]
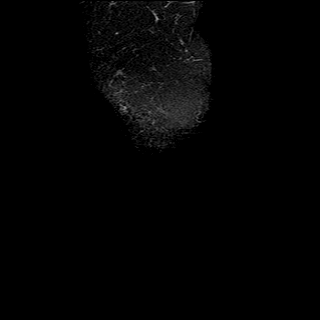

[Series 6: PD fat-sat · coronal · 3.0mm · 0.62mm/px · 7 of 30 slices shown (2 of 4)]
[im 1/30]
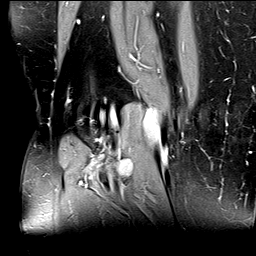
[im 5/30]
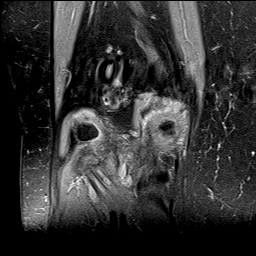
[im 10/30]
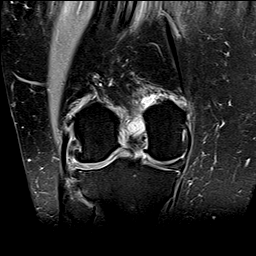
[im 15/30]
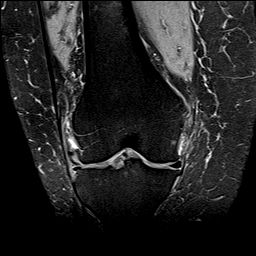
[im 20/30]
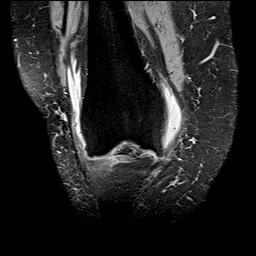
[im 25/30]
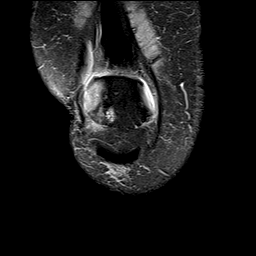
[im 30/30]
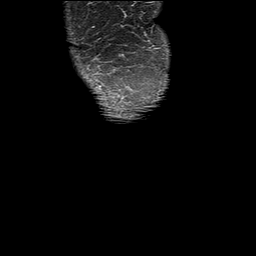

[Series 7: PD fat-sat · sagittal · 3.0mm · 0.62mm/px · 8 of 32 slices shown (3 of 4)]
[im 1/32]
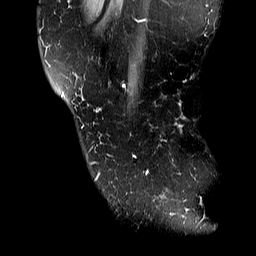
[im 5/32]
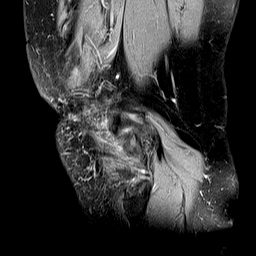
[im 9/32]
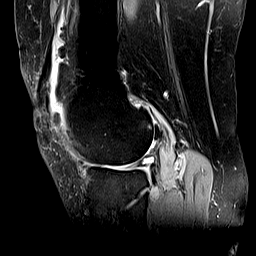
[im 14/32]
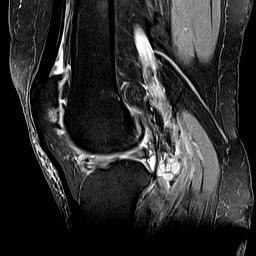
[im 18/32]
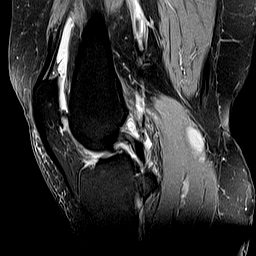
[im 23/32]
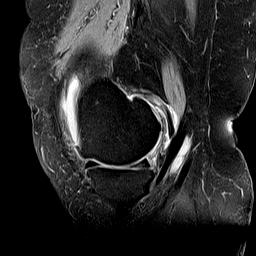
[im 27/32]
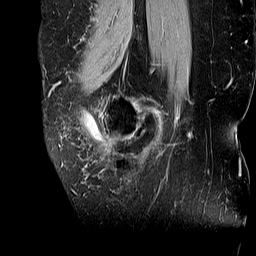
[im 32/32]
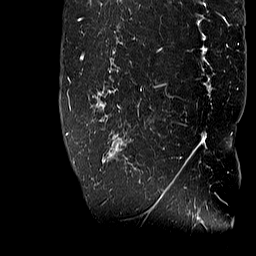

[Series 8: PD fat-sat · coronal · 2.0mm · 0.62mm/px · 3 of 13 slices shown (4 of 4)]
[im 1/13]
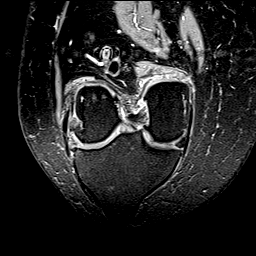
[im 7/13]
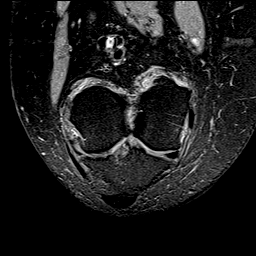
[im 13/13]
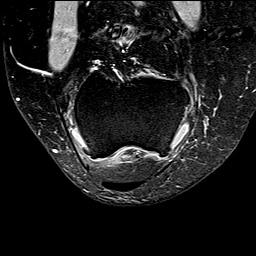

[40 of 40 positions shown; findings below may reference images not displayed]

FINDINGS: MENISCI

Medial meniscus: Mild increased signal in the posterior horn of the
medial meniscus likely reflecting degeneration without a discrete
tear.

Lateral meniscus: Severe attenuation of the body of the lateral
meniscus consistent with a chronic tear. Small oblique tear of the
posterior horn of the lateral meniscus extending to the inferior
articular surface.

LIGAMENTS

Cruciates: Intact ACL and PCL. Mild increased signal and expansion
of the ACL as can be seen with an ACL cyst.

Collaterals: Medial collateral ligament is intact. Lateral
collateral ligament complex is intact.

CARTILAGE

Patellofemoral: Full-thickness cartilage loss of the medial and
lateral patellar facets with subchondral reactive marrow change in
the lateral patellar facet. High-grade partial-thickness cartilage
loss of the trochlea.

Medial: Partial thickness cartilage loss of the medial femoral
condyle and medial tibial plateau mo or severe in the medial femoral
condyle.

Lateral: Extensive full-thickness cartilage loss of the lateral
femoral condyle and lateral tibial plateau with mild subchondral
reactive marrow edema in the lateral tibial plateau.

Joint: Small joint effusion. Normal Hoffa's fat. No plical
thickening.

Popliteal Fossa: Small Baker cyst. Intact popliteus tendon. 2.6 x
1.4 cm multiloculated cystic structure arising from the posterior
joint capsule likely representing a ganglion cyst which is
immediately anterior to the popliteal vessels.

Extensor Mechanism:  Intact.

Bones: Tricompartmental marginal osteophytosis. No other marrow
signal abnormality. No fracture or dislocation.
IMPRESSION: 1. Severe attenuation of the lateral meniscus consistent with a
chronic tear. Small oblique tear of the posterior horn of the
lateral meniscus extending to the inferior articular surface.
2. Tricompartmental cartilage abnormalities most severe in the
lateral femorotibial compartment as detailed above.
3. Small Baker cyst.  Small joint effusion.

## 2017-05-24 ENCOUNTER — Encounter: Payer: Medicare Other | Attending: Specialist

## 2017-05-24 VITALS — Ht 64.8 in | Wt 217.1 lb

## 2017-05-24 DIAGNOSIS — E039 Hypothyroidism, unspecified: Secondary | ICD-10-CM | POA: Diagnosis not present

## 2017-05-24 DIAGNOSIS — R7309 Other abnormal glucose: Secondary | ICD-10-CM | POA: Diagnosis not present

## 2017-05-24 DIAGNOSIS — Z7982 Long term (current) use of aspirin: Secondary | ICD-10-CM | POA: Diagnosis not present

## 2017-05-24 DIAGNOSIS — Z79899 Other long term (current) drug therapy: Secondary | ICD-10-CM | POA: Diagnosis not present

## 2017-05-24 DIAGNOSIS — Z87891 Personal history of nicotine dependence: Secondary | ICD-10-CM | POA: Diagnosis not present

## 2017-05-24 DIAGNOSIS — E785 Hyperlipidemia, unspecified: Secondary | ICD-10-CM | POA: Insufficient documentation

## 2017-05-24 DIAGNOSIS — J449 Chronic obstructive pulmonary disease, unspecified: Secondary | ICD-10-CM | POA: Diagnosis present

## 2017-05-24 DIAGNOSIS — I2510 Atherosclerotic heart disease of native coronary artery without angina pectoris: Secondary | ICD-10-CM | POA: Insufficient documentation

## 2017-05-24 NOTE — Progress Notes (Signed)
Pulmonary Individual Treatment Plan  Patient Details  Name: Claire Cortez MRN: [id number] Date of Birth: 04/11/1934 Referring Provider:     Pulmonary Rehab from 05/24/2017 in [facility] Cardiac and Pulmonary Rehab  Referring Provider  Brett Canales MD      Initial Encounter Date:    Pulmonary Rehab from 05/24/2017 in [facility] Cardiac and Pulmonary Rehab  Date  05/24/17  Referring Provider  Brett Canales MD      Visit Diagnosis: Chronic obstructive pulmonary disease, unspecified COPD type (HCC)  Patient's Home Medications on Admission:  Current Outpatient Prescriptions:  .  amitriptyline (ELAVIL) 25 MG tablet, Take by mouth., Disp: , Rfl:  .  aspirin EC 81 MG tablet, Take by mouth., Disp: , Rfl:  .  CO ENZYME Q-10 PO, Take 200 mg by mouth., Disp: , Rfl:  .  conjugated estrogens (PREMARIN) vaginal cream, Apply blueberry sized amount to vaginal opening with finger tip M-W-F nights., Disp: 42.5 g, Rfl: 12 .  Glucosamine HCl (SM GLUCOSAMINE HCL) 1500 MG TABS, Take by mouth., Disp: , Rfl:  .  levothyroxine (SYNTHROID, LEVOTHROID) 88 MCG tablet, TAKE 1 TABLET DAILY ON AN EMPTY STOMACH WITH A GLASS OF WATER AT LEAST 30 TO 60 MINUTES BEFORE BREAKFAST, Disp: , Rfl:  .  lisinopril (PRINIVIL,ZESTRIL) 10 MG tablet, TAKE 1 TABLET DAILY, Disp: , Rfl:  .  Multiple Vitamin (MULTI-VITAMINS) TABS, Take by mouth., Disp: , Rfl:  .  Omega-3 Fatty Acids (FISH OIL) 1200 MG CAPS, Take by mouth., Disp: , Rfl:  .  rosuvastatin (CRESTOR) 10 MG tablet, TAKE 1 TABLET NIGHTLY, Disp: , Rfl:  .  solifenacin (VESICARE) 5 MG tablet, Take 1 tablet (5 mg total) by mouth daily., Disp: 30 tablet, Rfl: 3 .  traMADol (ULTRAM) 50 MG tablet, Take by mouth., Disp: , Rfl:   Past Medical History: Past Medical History:  Diagnosis Date  . Acquired hypothyroidism 08/25/2015  . Arteriosclerosis of coronary artery 08/25/2015   Overview:  Sp cabg x3 with graft stent 2007   . Arthritis of knee, degenerative 04/10/2015  . Benign  essential HTN 10/24/2014  . Borderline diabetes mellitus 08/25/2015  . Chronic cervical pain 08/25/2015  . Chronic diarrhea 09/24/2014  . Combined fat and carbohydrate induced hyperlipemia 10/24/2014    Tobacco Use: History  Smoking Status  . Former Smoker  . Packs/day: 1.50  . Years: 15.00  . Types: Cigarettes  . Quit date: 08/25/1974  Smokeless Tobacco  . Never Used    Comment: patient has not smoked since 1975    Labs: Recent Review Flowsheet Data    There is no flowsheet data to display.       ADL UCSD:     Pulmonary Assessment Scores    Row Name 05/24/17 1334         ADL UCSD   ADL Phase Entry     SOB Score total 32     Rest 1     Walk 2     Stairs 3     Bath 0     Dress 0     Shop 3       mMRC Score   mMRC Score 1        Pulmonary Function Assessment:     Pulmonary Function Assessment - 05/24/17 1330      Pulmonary Function Tests   FVC% 76 %   FEV1% 76 %   FEV1/FVC Ratio 72     Breath   Bilateral Breath Sounds Clear  Shortness of Breath Fear of Shortness of Breath      Exercise Target Goals: Date: 05/24/17  Exercise Program Goal: Individual exercise prescription set with THRR, safety & activity barriers. Participant demonstrates ability to understand and report RPE using BORG scale, to self-measure pulse accurately, and to acknowledge the importance of the exercise prescription.  Exercise Prescription Goal: Starting with aerobic activity 30 plus minutes a day, 3 days per week for initial exercise prescription. Provide home exercise prescription and guidelines that participant acknowledges understanding prior to discharge.  Activity Barriers & Risk Stratification:     Activity Barriers & Cardiac Risk Stratification - 05/24/17 1341      Activity Barriers & Cardiac Risk Stratification   Activity Barriers Right Knee Replacement;Deconditioning;Muscular Weakness;Shortness of Breath      6 Minute Walk:     6 Minute Walk    Row  Name 05/24/17 1343         6 Minute Walk   Phase Initial     Distance 970 feet     Walk Time 4.98 minutes     # of Rest Breaks 2     MPH 2.21     METS 7.92     RPE 13     Perceived Dyspnea  2     VO2 Peak 6.72     Symptoms Yes (comment)     Comments leg fatigue, compensating for Knee, SOB     Resting HR 93 bpm     Resting BP 134/56     Max Ex. HR 140 bpm     Max Ex. BP 162/70     2 Minute Post BP 154/72       Interval HR   Baseline HR 93     1 Minute HR 117     2 Minute HR 130     3 Minute HR 140     4 Minute HR 138     5 Minute HR 134     6 Minute HR 130     2 Minute Post HR 102     Interval Heart Rate? Yes       Interval Oxygen   Interval Oxygen? Yes     Baseline Oxygen Saturation % 92 %     Baseline Liters of Oxygen 0 L  Room Air     1 Minute Oxygen Saturation % 92 %     1 Minute Liters of Oxygen 0 L     2 Minute Oxygen Saturation % 91 %     2 Minute Liters of Oxygen 0 L     3 Minute Oxygen Saturation % 90 %     3 Minute Liters of Oxygen 0 L     4 Minute Oxygen Saturation % 90 %     4 Minute Liters of Oxygen 0 L     5 Minute Oxygen Saturation % 93 %     5 Minute Liters of Oxygen 0 L     6 Minute Oxygen Saturation % 92 %     6 Minute Liters of Oxygen 0 L     2 Minute Post Oxygen Saturation % 94 %     2 Minute Post Liters of Oxygen 0 L       Oxygen Initial Assessment:     Oxygen Initial Assessment - 05/24/17 1326      Home Oxygen   Home Oxygen Device Home Concentrator   Sleep Oxygen Prescription Continuous   Liters per minute 2  Home Exercise Oxygen Prescription None   Home at Rest Exercise Oxygen Prescription None   Compliance with Home Oxygen Use Yes     Initial 6 min Walk   Oxygen Used None     Program Oxygen Prescription   Program Oxygen Prescription None     Intervention   Short Term Goals To learn and understand importance of monitoring SPO2 with pulse oximeter and demonstrate accurate use of the pulse oximeter.;To Learn and  understand importance of maintaining oxygen saturations>88%;To learn and demonstrate proper purse lipped breathing techniques or other breathing techniques.;To learn and demonstrate proper use of respiratory medications   Long  Term Goals Maintenance of O2 saturations>88%;Compliance with respiratory medication;Demonstrates proper use of MDI's;Exhibits proper breathing techniques, such as purse lipped breathing or other method taught during program session;Verbalizes importance of monitoring SPO2 with pulse oximeter and return demonstration      Oxygen Re-Evaluation:   Oxygen Discharge (Final Oxygen Re-Evaluation):   Initial Exercise Prescription:     Initial Exercise Prescription - 05/24/17 1400      Date of Initial Exercise RX and Referring Provider   Date 05/24/17   Referring Provider Brett Canales MD     Treadmill   MPH 1.2   Grade 0   Minutes 15   METs 1.92     NuStep   Level 1   SPM 80   Minutes 15   METs 2     REL-XR   Level 1   Speed 50   Minutes 15   METs 2     Prescription Details   Frequency (times per week) 3   Duration Progress to 45 minutes of aerobic exercise without signs/symptoms of physical distress     Intensity   THRR 40-80% of Max Heartrate 111-130   Ratings of Perceived Exertion 11-13   Perceived Dyspnea 0-4     Progression   Progression Continue to progress workloads to maintain intensity without signs/symptoms of physical distress.     Resistance Training   Training Prescription Yes   Weight 3 lbs   Reps 10-15      Perform Capillary Blood Glucose checks as needed.  Exercise Prescription Changes:   Exercise Comments:   Exercise Goals and Review:     Exercise Goals    Row Name 05/24/17 1407             Exercise Goals   Increase Physical Activity Yes       Intervention Provide advice, education, support and counseling about physical activity/exercise needs.;Develop an individualized exercise prescription for aerobic  and resistive training based on initial evaluation findings, risk stratification, comorbidities and participant's personal goals.       Expected Outcomes Achievement of increased cardiorespiratory fitness and enhanced flexibility, muscular endurance and strength shown through measurements of functional capacity and personal statement of participant.       Increase Strength and Stamina Yes       Intervention Provide advice, education, support and counseling about physical activity/exercise needs.;Develop an individualized exercise prescription for aerobic and resistive training based on initial evaluation findings, risk stratification, comorbidities and participant's personal goals.       Expected Outcomes Achievement of increased cardiorespiratory fitness and enhanced flexibility, muscular endurance and strength shown through measurements of functional capacity and personal statement of participant.          Exercise Goals Re-Evaluation :   Discharge Exercise Prescription (Final Exercise Prescription Changes):   Nutrition:  Target Goals: Understanding of nutrition guidelines, daily intake  of sodium 1500mg , cholesterol 200mg , calories 30% from fat and 7% or less from saturated fats, daily to have 5 or more servings of fruits and vegetables.  Biometrics:     Pre Biometrics - 05/24/17 1407      Pre Biometrics   Height 5' 4.8" (1.646 m)   Weight 217 lb 1.6 oz (98.5 kg)   Waist Circumference 40 inches   Hip Circumference 50.5 inches   Waist to Hip Ratio 0.79 %   BMI (Calculated) 36.4       Nutrition Therapy Plan and Nutrition Goals:   Nutrition Discharge: Rate Your Plate Scores:   Nutrition Goals Re-Evaluation:   Nutrition Goals Discharge (Final Nutrition Goals Re-Evaluation):   Psychosocial: Target Goals: Acknowledge presence or absence of significant depression and/or stress, maximize coping skills, provide positive support system. Participant is able to verbalize types  and ability to use techniques and skills needed for reducing stress and depression.   Initial Review & Psychosocial Screening:     Initial Psych Review & Screening - 05/24/17 1334      Initial Review   Current issues with None Identified     Family Dynamics   Good Support System? Yes     Barriers   Psychosocial barriers to participate in program There are no identifiable barriers or psychosocial needs.     Screening Interventions   Interventions Encouraged to exercise      Quality of Life Scores:     Quality of Life - 05/24/17 1337      Quality of Life Scores   Health/Function Pre 20.79 %   Socioeconomic Pre 23.33 %   Psych/Spiritual Pre 23.57 %   Family Pre 27.5 %   GLOBAL Pre 22.62 %      PHQ-9: Recent Review Flowsheet Data    Depression screen [facility] 2/9 05/24/2017   Decreased Interest 0   Down, Depressed, Hopeless 0   PHQ - 2 Score 0   Altered sleeping 2   Tired, decreased energy 2   Change in appetite 1   Feeling bad or failure about yourself  0   Trouble concentrating 0   Moving slowly or fidgety/restless 0   Suicidal thoughts 0   PHQ-9 Score 5   Difficult doing work/chores Not difficult at all     Interpretation of Total Score  Total Score Depression Severity:  1-4 = Minimal depression, 5-9 = Mild depression, 10-14 = Moderate depression, 15-19 = Moderately severe depression, 20-27 = Severe depression   Psychosocial Evaluation and Intervention:   Psychosocial Re-Evaluation:   Psychosocial Discharge (Final Psychosocial Re-Evaluation):   Education: Education Goals: Education classes will be provided on a weekly basis, covering required topics. Participant will state understanding/return demonstration of topics presented.  Learning Barriers/Preferences:     Learning Barriers/Preferences - 05/24/17 1328      Learning Barriers/Preferences   Learning Barriers None   Learning Preferences None      Education Topics: Initial Evaluation  Education: - Verbal, written and demonstration of respiratory meds, RPE/PD scales, oximetry and breathing techniques. Instruction on use of nebulizers and MDIs: cleaning and proper use, rinsing mouth with steroid doses and importance of monitoring MDI activations.   Pulmonary Rehab from 05/24/2017 in [facility] Cardiac and Pulmonary Rehab  Date  05/24/17  Educator  [facility]  Instruction Review Code  2- meets goals/outcomes      General Nutrition Guidelines/Fats and Fiber: -Group instruction provided by verbal, written material, models and posters to present the general guidelines for heart healthy  nutrition. Gives an explanation and review of dietary fats and fiber.   Pulmonary Rehab from 05/24/2017 in [facility] Cardiac and Pulmonary Rehab  Date  05/24/17  Educator  [facility]  Instruction Review Code  2- meets goals/outcomes      Controlling Sodium/Reading Food Labels: -Group verbal and written material supporting the discussion of sodium use in heart healthy nutrition. Review and explanation with models, verbal and written materials for utilization of the food label.   Exercise Physiology & Risk Factors: - Group verbal and written instruction with models to review the exercise physiology of the cardiovascular system and associated critical values. Details cardiovascular disease risk factors and the goals associated with each risk factor.   Aerobic Exercise & Resistance Training: - Gives group verbal and written discussion on the health impact of inactivity. On the components of aerobic and resistive training programs and the benefits of this training and how to safely progress through these programs.   Flexibility, Balance, General Exercise Guidelines: - Provides group verbal and written instruction on the benefits of flexibility and balance training programs. Provides general exercise guidelines with specific guidelines to those with heart or lung disease. Demonstration and skill practice  provided.   Stress Management: - Provides group verbal and written instruction about the health risks of elevated stress, cause of high stress, and healthy ways to reduce stress.   Depression: - Provides group verbal and written instruction on the correlation between heart/lung disease and depressed mood, treatment options, and the stigmas associated with seeking treatment.   Exercise & Equipment Safety: - Individual verbal instruction and demonstration of equipment use and safety with use of the equipment.   Infection Prevention: - Provides verbal and written material to individual with discussion of infection control including proper hand washing and proper equipment cleaning during exercise session.   Pulmonary Rehab from 05/24/2017 in [facility] Cardiac and Pulmonary Rehab  Date  05/24/17  Educator  [facility]  Instruction Review Code  2- meets goals/outcomes      Falls Prevention: - Provides verbal and written material to individual with discussion of falls prevention and safety.   Pulmonary Rehab from 05/24/2017 in [facility] Cardiac and Pulmonary Rehab  Date  05/24/17  Educator  [facility]  Instruction Review Code  2- meets goals/outcomes      Diabetes: - Individual verbal and written instruction to review signs/symptoms of diabetes, desired ranges of glucose level fasting, after meals and with exercise. Advice that pre and post exercise glucose checks will be done for 3 sessions at entry of program.   Chronic Lung Diseases: - Group verbal and written instruction to review new updates, new respiratory medications, new advancements in procedures and treatments. Provide informative websites and "800" numbers of self-education.   Lung Procedures: - Group verbal and written instruction to describe testing methods done to diagnose lung disease. Review the outcome of test results. Describe the treatment choices: Pulmonary Function Tests, ABGs and oximetry.   Energy Conservation: - Provide group  verbal and written instruction for methods to conserve energy, plan and organize activities. Instruct on pacing techniques, use of adaptive equipment and posture/positioning to relieve shortness of breath.   Triggers: - Group verbal and written instruction to review types of environmental controls: home humidity, furnaces, filters, dust mite/pet prevention, HEPA vacuums. To discuss weather changes, air quality and the benefits of nasal washing.   Exacerbations: - Group verbal and written instruction to provide: warning signs, infection symptoms, calling MD promptly, preventive modes, and value of vaccinations. Review: effective  airway clearance, coughing and/or vibration techniques. Create an Sport and exercise psychologist.   Oxygen: - Individual and group verbal and written instruction on oxygen therapy. Includes supplement oxygen, available portable oxygen systems, continuous and intermittent flow rates, oxygen safety, concentrators, and Medicare reimbursement for oxygen.   Respiratory Medications: - Group verbal and written instruction to review medications for lung disease. Drug class, frequency, complications, importance of spacers, rinsing mouth after steroid MDI's, and proper cleaning methods for nebulizers.   Pulmonary Rehab from 05/24/2017 in [facility] Cardiac and Pulmonary Rehab  Date  05/24/17  Educator  [facility]  Instruction Review Code  2- meets goals/outcomes      AED/CPR: - Group verbal and written instruction with the use of models to demonstrate the basic use of the AED with the basic ABC's of resuscitation.   Breathing Retraining: - Provides individuals verbal and written instruction on purpose, frequency, and proper technique of diaphragmatic breathing and pursed-lipped breathing. Applies individual practice skills.   Pulmonary Rehab from 05/24/2017 in [facility] Cardiac and Pulmonary Rehab  Date  05/24/17  Educator  [facility]  Instruction Review Code  2- meets goals/outcomes      Anatomy and Physiology  of the Lungs: - Group verbal and written instruction with the use of models to provide basic lung anatomy and physiology related to function, structure and complications of lung disease.   Heart Failure: - Group verbal and written instruction on the basics of heart failure: signs/symptoms, treatments, explanation of ejection fraction, enlarged heart and cardiomyopathy.   Sleep Apnea: - Individual verbal and written instruction to review Obstructive Sleep Apnea. Review of risk factors, methods for diagnosing and types of masks and machines for OSA.   Anxiety: - Provides group, verbal and written instruction on the correlation between heart/lung disease and anxiety, treatment options, and management of anxiety.   Relaxation: - Provides group, verbal and written instruction about the benefits of relaxation for patients with heart/lung disease. Also provides patients with examples of relaxation techniques.   Knowledge Questionnaire Score:     Knowledge Questionnaire Score - 05/24/17 1329      Knowledge Questionnaire Score   Pre Score 7/10       Core Components/Risk Factors/Patient Goals at Admission:     Personal Goals and Risk Factors at Admission - 05/24/17 1320      Core Components/Risk Factors/Patient Goals on Admission    Weight Management Yes;Obesity   Intervention Weight Management: Develop a combined nutrition and exercise program designed to reach desired caloric intake, while maintaining appropriate intake of nutrient and fiber, sodium and fats, and appropriate energy expenditure required for the weight goal.;Weight Management: Provide education and appropriate resources to help participant work on and attain dietary goals.;Weight Management/Obesity: Establish reasonable short term and long term weight goals.;Obesity: Provide education and appropriate resources to help participant work on and attain dietary goals.   Admit Weight 217 lb 1.6 oz (98.5 kg)   Goal Weight:  Short Term 212 lb (96.2 kg)   Goal Weight: Long Term 180 lb (81.6 kg)   Expected Outcomes Short Term: Continue to assess and modify interventions until short term weight is achieved;Long Term: Adherence to nutrition and physical activity/exercise program aimed toward attainment of established weight goal;Weight Maintenance: Understanding of the daily nutrition guidelines, which includes 25-35% calories from fat, 7% or less cal from saturated fats, less than 200mg  cholesterol, less than 1.5gm of sodium, & 5 or more servings of fruits and vegetables daily;Weight Loss: Understanding of general recommendations for a balanced deficit meal  plan, which promotes 1-2 lb weight loss per week and includes a negative energy balance of [phone] kcal/d;Understanding recommendations for meals to include 15-35% energy as protein, 25-35% energy from fat, 35-60% energy from carbohydrates, less than 200mg  of dietary cholesterol, 20-35 gm of total fiber daily;Understanding of distribution of calorie intake throughout the day with the consumption of 4-5 meals/snacks   Tobacco Cessation --  patient does not smoke    Improve shortness of breath with ADL's Yes   Intervention Provide education, individualized exercise plan and daily activity instruction to help decrease symptoms of SOB with activities of daily living.   Expected Outcomes Short Term: Achieves a reduction of symptoms when performing activities of daily living.   Develop more efficient breathing techniques such as purse lipped breathing and diaphragmatic breathing; and practicing self-pacing with activity Yes   Intervention Provide education, demonstration and support about specific breathing techniuqes utilized for more efficient breathing. Include techniques such as pursed lipped breathing, diaphragmatic breathing and self-pacing activity.   Expected Outcomes Short Term: Participant will be able to demonstrate and use breathing techniques as needed throughout daily  activities.   Increase knowledge of respiratory medications and ability to use respiratory devices properly  Yes   Intervention Provide education and demonstration as needed of appropriate use of medications, inhalers, and oxygen therapy.   Expected Outcomes Short Term: Achieves understanding of medications use. Understands that oxygen is a medication prescribed by physician. Demonstrates appropriate use of inhaler and oxygen therapy.   Diabetes Yes  Pre diabetic   Intervention Provide education about signs/symptoms and action to take for hypo/hyperglycemia.;Provide education about proper nutrition, including hydration, and aerobic/resistive exercise prescription along with prescribed medications to achieve blood glucose in normal ranges: Fasting glucose 65-99 mg/dL   Expected Outcomes Short Term: Participant verbalizes understanding of the signs/symptoms and immediate care of hyper/hypoglycemia, proper foot care and importance of medication, aerobic/resistive exercise and nutrition plan for blood glucose control.;Long Term: Attainment of HbA1C < 7%.   Heart Failure Yes   Intervention Provide a combined exercise and nutrition program that is supplemented with education, support and counseling about heart failure. Directed toward relieving symptoms such as shortness of breath, decreased exercise tolerance, and extremity edema.   Expected Outcomes Improve functional capacity of life;Short term: Attendance in program 2-3 days a week with increased exercise capacity. Reported lower sodium intake. Reported increased fruit and vegetable intake. Reports medication compliance.;Short term: Daily weights obtained and reported for increase. Utilizing diuretic protocols set by physician.;Long term: Adoption of self-care skills and reduction of barriers for early signs and symptoms recognition and intervention leading to self-care maintenance.   Lipids Yes   Intervention Provide education and support for participant  on nutrition & aerobic/resistive exercise along with prescribed medications to achieve LDL 70mg , HDL >40mg .   Expected Outcomes Short Term: Participant states understanding of desired cholesterol values and is compliant with medications prescribed. Participant is following exercise prescription and nutrition guidelines.;Long Term: Cholesterol controlled with medications as prescribed, with individualized exercise RX and with personalized nutrition plan. Value goals: LDL < 70mg , HDL > 40 mg.      Core Components/Risk Factors/Patient Goals Review:    Core Components/Risk Factors/Patient Goals at Discharge (Final Review):    ITP Comments:     ITP Comments    Row Name 05/24/17 1315           ITP Comments Medical evaluation completed. Visit diagnosis can be found in [facility] encounter 05/24/17, Initial ITP sent to Dr. Bethann Punches  director of [facility] for review and changes.          Comments: Initial ITP

## 2017-05-24 NOTE — Progress Notes (Signed)
Daily Session Note  Patient Details  Name: Claire Cortez MRN: [id number] Date of Birth: May 13, 1934 Referring Provider:     Pulmonary Rehab from 05/24/2017 in [facility]  Referring Provider  Ancil Linsey MD      Encounter Date: 05/24/2017  Check In:     Session Check In - 05/24/17 1312      Check-In   Location [facility]   Staff Present Justin Mend Lorre Nick, [place], ACSM RCEP, Exercise Physiologist   Supervising physician immediately available to respond to emergencies [facility] immediately available ER MD   Physician(s) Dr. Mariea Clonts and Corky Downs   Medication changes reported     No   Fall or balance concerns reported    No   Tobacco Cessation No Change   Warm-up and Cool-down Not performed (comment)  Med Evaluation   Resistance Training Performed No  Medical Evaluation   VAD Patient? No     Pain Assessment   Currently in Pain? No/denies   Multiple Pain Sites No         History  Smoking Status  . Former Smoker  . Packs/day: 1.50  . Years: 15.00  . Types: Cigarettes  . Quit date: 08/25/1974  Smokeless Tobacco  . Never Used    Comment: patient has not smoked since 1975    Goals Met:  Exercise tolerated well Personal goals reviewed Queuing for purse lip breathing  Goals Unmet:  Not Applicable  Comments: Medical Evaluation completed   Dr. Emily Filbert is Medical Director for [facility] and [facility].

## 2017-05-24 NOTE — Patient Instructions (Signed)
Patient Instructions  Patient Details  Name: Claire Cortez MRN: [id number] Date of Birth: [birth year] Referring Provider:  Mertie Moores, MD  Below are the personal goals you chose as well as exercise and nutrition goals. Our goal is to help you keep on track towards obtaining and maintaining your goals. We will be discussing your progress on these goals with you throughout the program.  Initial Exercise Prescription:     Initial Exercise Prescription - 05/24/17 1400      Date of Initial Exercise RX and Referring Provider   Date 05/24/17   Referring Provider Brett Canales MD     Treadmill   MPH 1.2   Grade 0   Minutes 15   METs 1.92     NuStep   Level 1   SPM 80   Minutes 15   METs 2     REL-XR   Level 1   Speed 50   Minutes 15   METs 2     Prescription Details   Frequency (times per week) 3   Duration Progress to 45 minutes of aerobic exercise without signs/symptoms of physical distress     Intensity   THRR 40-80% of Max Heartrate 111-130   Ratings of Perceived Exertion 11-13   Perceived Dyspnea 0-4     Progression   Progression Continue to progress workloads to maintain intensity without signs/symptoms of physical distress.     Resistance Training   Training Prescription Yes   Weight 3 lbs   Reps 10-15      Exercise Goals: Frequency: Be able to perform aerobic exercise three times per week working toward 3-5 days per week.  Intensity: Work with a perceived exertion of 11 (fairly light) - 15 (hard) as tolerated. Follow your new exercise prescription and watch for changes in prescription as you progress with the program. Changes will be reviewed with you when they are made.  Duration: You should be able to do 30 minutes of continuous aerobic exercise in addition to a 5 minute warm-up and a 5 minute cool-down routine.  Nutrition Goals: Your personal nutrition goals will be established when you do your nutrition analysis with the dietician.  The  following are nutrition guidelines to follow: Cholesterol < 200mg /day Sodium < 1500mg /day Fiber: Women over 50 yrs - 21 grams per day  Personal Goals:     Personal Goals and Risk Factors at Admission - 05/24/17 1320      Core Components/Risk Factors/Patient Goals on Admission    Weight Management Yes;Obesity   Intervention Weight Management: Develop a combined nutrition and exercise program designed to reach desired caloric intake, while maintaining appropriate intake of nutrient and fiber, sodium and fats, and appropriate energy expenditure required for the weight goal.;Weight Management: Provide education and appropriate resources to help participant work on and attain dietary goals.;Weight Management/Obesity: Establish reasonable short term and long term weight goals.;Obesity: Provide education and appropriate resources to help participant work on and attain dietary goals.   Admit Weight 217 lb 1.6 oz (98.5 kg)   Goal Weight: Short Term 212 lb (96.2 kg)   Goal Weight: Long Term 180 lb (81.6 kg)   Expected Outcomes Short Term: Continue to assess and modify interventions until short term weight is achieved;Long Term: Adherence to nutrition and physical activity/exercise program aimed toward attainment of established weight goal;Weight Maintenance: Understanding of the daily nutrition guidelines, which includes 25-35% calories from fat, 7% or less cal from saturated fats, less than 200mg  cholesterol, less than 1.5gm  of sodium, & 5 or more servings of fruits and vegetables daily;Weight Loss: Understanding of general recommendations for a balanced deficit meal plan, which promotes 1-2 lb weight loss per week and includes a negative energy balance of [phone] kcal/d;Understanding recommendations for meals to include 15-35% energy as protein, 25-35% energy from fat, 35-60% energy from carbohydrates, less than 200mg  of dietary cholesterol, 20-35 gm of total fiber daily;Understanding of distribution of  calorie intake throughout the day with the consumption of 4-5 meals/snacks   Tobacco Cessation --  patient does not smoke    Improve shortness of breath with ADL's Yes   Intervention Provide education, individualized exercise plan and daily activity instruction to help decrease symptoms of SOB with activities of daily living.   Expected Outcomes Short Term: Achieves a reduction of symptoms when performing activities of daily living.   Develop more efficient breathing techniques such as purse lipped breathing and diaphragmatic breathing; and practicing self-pacing with activity Yes   Intervention Provide education, demonstration and support about specific breathing techniuqes utilized for more efficient breathing. Include techniques such as pursed lipped breathing, diaphragmatic breathing and self-pacing activity.   Expected Outcomes Short Term: Participant will be able to demonstrate and use breathing techniques as needed throughout daily activities.   Increase knowledge of respiratory medications and ability to use respiratory devices properly  Yes   Intervention Provide education and demonstration as needed of appropriate use of medications, inhalers, and oxygen therapy.   Expected Outcomes Short Term: Achieves understanding of medications use. Understands that oxygen is a medication prescribed by physician. Demonstrates appropriate use of inhaler and oxygen therapy.   Diabetes Yes  Pre diabetic   Intervention Provide education about signs/symptoms and action to take for hypo/hyperglycemia.;Provide education about proper nutrition, including hydration, and aerobic/resistive exercise prescription along with prescribed medications to achieve blood glucose in normal ranges: Fasting glucose 65-99 mg/dL   Expected Outcomes Short Term: Participant verbalizes understanding of the signs/symptoms and immediate care of hyper/hypoglycemia, proper foot care and importance of medication, aerobic/resistive  exercise and nutrition plan for blood glucose control.;Long Term: Attainment of HbA1C < 7%.   Heart Failure Yes   Intervention Provide a combined exercise and nutrition program that is supplemented with education, support and counseling about heart failure. Directed toward relieving symptoms such as shortness of breath, decreased exercise tolerance, and extremity edema.   Expected Outcomes Improve functional capacity of life;Short term: Attendance in program 2-3 days a week with increased exercise capacity. Reported lower sodium intake. Reported increased fruit and vegetable intake. Reports medication compliance.;Short term: Daily weights obtained and reported for increase. Utilizing diuretic protocols set by physician.;Long term: Adoption of self-care skills and reduction of barriers for early signs and symptoms recognition and intervention leading to self-care maintenance.   Lipids Yes   Intervention Provide education and support for participant on nutrition & aerobic/resistive exercise along with prescribed medications to achieve LDL 70mg , HDL >40mg .   Expected Outcomes Short Term: Participant states understanding of desired cholesterol values and is compliant with medications prescribed. Participant is following exercise prescription and nutrition guidelines.;Long Term: Cholesterol controlled with medications as prescribed, with individualized exercise RX and with personalized nutrition plan. Value goals: LDL < 70mg , HDL > 40 mg.      Tobacco Use Initial Evaluation: History  Smoking Status  . Former Smoker  . Packs/day: 1.50  . Years: 15.00  . Types: Cigarettes  . Quit date: 08/25/1974  Smokeless Tobacco  . Never Used    Comment: patient has not  smoked since 1975    Copy of goals given to participant.

## 2017-05-30 DIAGNOSIS — J449 Chronic obstructive pulmonary disease, unspecified: Secondary | ICD-10-CM

## 2017-05-30 NOTE — Progress Notes (Signed)
Pulmonary Individual Treatment Plan  Patient Details  Name: Claire Cortez MRN: [id number] Date of Birth: [birth year] Referring Provider:     Pulmonary Rehab from 05/24/2017 in [facility] Cardiac and Pulmonary Rehab  Referring Provider  Brett Canales MD      Initial Encounter Date:    Pulmonary Rehab from 05/24/2017 in [facility] Cardiac and Pulmonary Rehab  Date  05/24/17  Referring Provider  Brett Canales MD      Visit Diagnosis: Chronic obstructive pulmonary disease, unspecified COPD type (HCC)  Patient's Home Medications on Admission:  Current Outpatient Prescriptions:  .  amitriptyline (ELAVIL) 25 MG tablet, Take by mouth., Disp: , Rfl:  .  aspirin EC 81 MG tablet, Take by mouth., Disp: , Rfl:  .  CO ENZYME Q-10 PO, Take 200 mg by mouth., Disp: , Rfl:  .  conjugated estrogens (PREMARIN) vaginal cream, Apply blueberry sized amount to vaginal opening with finger tip M-W-F nights., Disp: 42.5 g, Rfl: 12 .  Glucosamine HCl (SM GLUCOSAMINE HCL) 1500 MG TABS, Take by mouth., Disp: , Rfl:  .  levothyroxine (SYNTHROID, LEVOTHROID) 88 MCG tablet, TAKE 1 TABLET DAILY ON AN EMPTY STOMACH WITH A GLASS OF WATER AT LEAST 30 TO 60 MINUTES BEFORE BREAKFAST, Disp: , Rfl:  .  lisinopril (PRINIVIL,ZESTRIL) 10 MG tablet, TAKE 1 TABLET DAILY, Disp: , Rfl:  .  Multiple Vitamin (MULTI-VITAMINS) TABS, Take by mouth., Disp: , Rfl:  .  Omega-3 Fatty Acids (FISH OIL) 1200 MG CAPS, Take by mouth., Disp: , Rfl:  .  rosuvastatin (CRESTOR) 10 MG tablet, TAKE 1 TABLET NIGHTLY, Disp: , Rfl:  .  solifenacin (VESICARE) 5 MG tablet, Take 1 tablet (5 mg total) by mouth daily., Disp: 30 tablet, Rfl: 3 .  traMADol (ULTRAM) 50 MG tablet, Take by mouth., Disp: , Rfl:   Past Medical History: Past Medical History:  Diagnosis Date  . Acquired hypothyroidism 08/25/2015  . Arteriosclerosis of coronary artery 08/25/2015   Overview:  Sp cabg x3 with graft stent 2007   . Arthritis of knee, degenerative 04/10/2015  . Benign  essential HTN 10/24/2014  . Borderline diabetes mellitus 08/25/2015  . Chronic cervical pain 08/25/2015  . Chronic diarrhea 09/24/2014  . Combined fat and carbohydrate induced hyperlipemia 10/24/2014    Tobacco Use: History  Smoking Status  . Former Smoker  . Packs/day: 1.50  . Years: 15.00  . Types: Cigarettes  . Quit date: 08/25/1974  Smokeless Tobacco  . Never Used    Comment: patient has not smoked since 1975    Labs: Recent Review Flowsheet Data    There is no flowsheet data to display.       ADL UCSD:     Pulmonary Assessment Scores    Row Name 05/24/17 1334         ADL UCSD   ADL Phase Entry     SOB Score total 32     Rest 1     Walk 2     Stairs 3     Bath 0     Dress 0     Shop 3       mMRC Score   mMRC Score 1        Pulmonary Function Assessment:     Pulmonary Function Assessment - 05/24/17 1330      Pulmonary Function Tests   FVC% 76 %   FEV1% 76 %   FEV1/FVC Ratio 72     Breath   Bilateral Breath Sounds Clear  Shortness of Breath Fear of Shortness of Breath      Exercise Target Goals:    Exercise Program Goal: Individual exercise prescription set with THRR, safety & activity barriers. Participant demonstrates ability to understand and report RPE using BORG scale, to self-measure pulse accurately, and to acknowledge the importance of the exercise prescription.  Exercise Prescription Goal: Starting with aerobic activity 30 plus minutes a day, 3 days per week for initial exercise prescription. Provide home exercise prescription and guidelines that participant acknowledges understanding prior to discharge.  Activity Barriers & Risk Stratification:     Activity Barriers & Cardiac Risk Stratification - 05/24/17 1341      Activity Barriers & Cardiac Risk Stratification   Activity Barriers Right Knee Replacement;Deconditioning;Muscular Weakness;Shortness of Breath      6 Minute Walk:     6 Minute Walk    Row Name 05/24/17  1343         6 Minute Walk   Phase Initial     Distance 970 feet     Walk Time 4.98 minutes     # of Rest Breaks 2     MPH 2.21     METS 7.92     RPE 13     Perceived Dyspnea  2     VO2 Peak 6.72     Symptoms Yes (comment)     Comments leg fatigue, compensating for Knee, SOB     Resting HR 93 bpm     Resting BP 134/56     Max Ex. HR 140 bpm     Max Ex. BP 162/70     2 Minute Post BP 154/72       Interval HR   Baseline HR 93     1 Minute HR 117     2 Minute HR 130     3 Minute HR 140     4 Minute HR 138     5 Minute HR 134     6 Minute HR 130     2 Minute Post HR 102     Interval Heart Rate? Yes       Interval Oxygen   Interval Oxygen? Yes     Baseline Oxygen Saturation % 92 %     Baseline Liters of Oxygen 0 L  Room Air     1 Minute Oxygen Saturation % 92 %     1 Minute Liters of Oxygen 0 L     2 Minute Oxygen Saturation % 91 %     2 Minute Liters of Oxygen 0 L     3 Minute Oxygen Saturation % 90 %     3 Minute Liters of Oxygen 0 L     4 Minute Oxygen Saturation % 90 %     4 Minute Liters of Oxygen 0 L     5 Minute Oxygen Saturation % 93 %     5 Minute Liters of Oxygen 0 L     6 Minute Oxygen Saturation % 92 %     6 Minute Liters of Oxygen 0 L     2 Minute Post Oxygen Saturation % 94 %     2 Minute Post Liters of Oxygen 0 L       Oxygen Initial Assessment:     Oxygen Initial Assessment - 05/24/17 1326      Home Oxygen   Home Oxygen Device Home Concentrator   Sleep Oxygen Prescription Continuous   Liters per minute 2  Home Exercise Oxygen Prescription None   Home at Rest Exercise Oxygen Prescription None   Compliance with Home Oxygen Use Yes     Initial 6 min Walk   Oxygen Used None     Program Oxygen Prescription   Program Oxygen Prescription None     Intervention   Short Term Goals To learn and understand importance of monitoring SPO2 with pulse oximeter and demonstrate accurate use of the pulse oximeter.;To Learn and understand importance  of maintaining oxygen saturations>88%;To learn and demonstrate proper purse lipped breathing techniques or other breathing techniques.;To learn and demonstrate proper use of respiratory medications   Long  Term Goals Maintenance of O2 saturations>88%;Compliance with respiratory medication;Demonstrates proper use of MDI's;Exhibits proper breathing techniques, such as purse lipped breathing or other method taught during program session;Verbalizes importance of monitoring SPO2 with pulse oximeter and return demonstration      Oxygen Re-Evaluation:   Oxygen Discharge (Final Oxygen Re-Evaluation):   Initial Exercise Prescription:     Initial Exercise Prescription - 05/24/17 1400      Date of Initial Exercise RX and Referring Provider   Date 05/24/17   Referring Provider Brett Canales MD     Treadmill   MPH 1.2   Grade 0   Minutes 15   METs 1.92     NuStep   Level 1   SPM 80   Minutes 15   METs 2     REL-XR   Level 1   Speed 50   Minutes 15   METs 2     Prescription Details   Frequency (times per week) 3   Duration Progress to 45 minutes of aerobic exercise without signs/symptoms of physical distress     Intensity   THRR 40-80% of Max Heartrate 111-130   Ratings of Perceived Exertion 11-13   Perceived Dyspnea 0-4     Progression   Progression Continue to progress workloads to maintain intensity without signs/symptoms of physical distress.     Resistance Training   Training Prescription Yes   Weight 3 lbs   Reps 10-15      Perform Capillary Blood Glucose checks as needed.  Exercise Prescription Changes:   Exercise Comments:   Exercise Goals and Review:     Exercise Goals    Row Name 05/24/17 1407             Exercise Goals   Increase Physical Activity Yes       Intervention Provide advice, education, support and counseling about physical activity/exercise needs.;Develop an individualized exercise prescription for aerobic and resistive training  based on initial evaluation findings, risk stratification, comorbidities and participant's personal goals.       Expected Outcomes Achievement of increased cardiorespiratory fitness and enhanced flexibility, muscular endurance and strength shown through measurements of functional capacity and personal statement of participant.       Increase Strength and Stamina Yes       Intervention Provide advice, education, support and counseling about physical activity/exercise needs.;Develop an individualized exercise prescription for aerobic and resistive training based on initial evaluation findings, risk stratification, comorbidities and participant's personal goals.       Expected Outcomes Achievement of increased cardiorespiratory fitness and enhanced flexibility, muscular endurance and strength shown through measurements of functional capacity and personal statement of participant.          Exercise Goals Re-Evaluation :   Discharge Exercise Prescription (Final Exercise Prescription Changes):   Nutrition:  Target Goals: Understanding of nutrition guidelines, daily intake  of sodium 1500mg , cholesterol 200mg , calories 30% from fat and 7% or less from saturated fats, daily to have 5 or more servings of fruits and vegetables.  Biometrics:     Pre Biometrics - 05/24/17 1407      Pre Biometrics   Height 5' 4.8" (1.646 m)   Weight 217 lb 1.6 oz (98.5 kg)   Waist Circumference 40 inches   Hip Circumference 50.5 inches   Waist to Hip Ratio 0.79 %   BMI (Calculated) 36.4       Nutrition Therapy Plan and Nutrition Goals:   Nutrition Discharge: Rate Your Plate Scores:   Nutrition Goals Re-Evaluation:   Nutrition Goals Discharge (Final Nutrition Goals Re-Evaluation):   Psychosocial: Target Goals: Acknowledge presence or absence of significant depression and/or stress, maximize coping skills, provide positive support system. Participant is able to verbalize types and ability to use  techniques and skills needed for reducing stress and depression.   Initial Review & Psychosocial Screening:     Initial Psych Review & Screening - 05/24/17 1334      Initial Review   Current issues with None Identified     Family Dynamics   Good Support System? Yes     Barriers   Psychosocial barriers to participate in program There are no identifiable barriers or psychosocial needs.     Screening Interventions   Interventions Encouraged to exercise      Quality of Life Scores:     Quality of Life - 05/24/17 1337      Quality of Life Scores   Health/Function Pre 20.79 %   Socioeconomic Pre 23.33 %   Psych/Spiritual Pre 23.57 %   Family Pre 27.5 %   GLOBAL Pre 22.62 %      PHQ-9: Recent Review Flowsheet Data    Depression screen [facility] 2/9 05/24/2017   Decreased Interest 0   Down, Depressed, Hopeless 0   PHQ - 2 Score 0   Altered sleeping 2   Tired, decreased energy 2   Change in appetite 1   Feeling bad or failure about yourself  0   Trouble concentrating 0   Moving slowly or fidgety/restless 0   Suicidal thoughts 0   PHQ-9 Score 5   Difficult doing work/chores Not difficult at all     Interpretation of Total Score  Total Score Depression Severity:  1-4 = Minimal depression, 5-9 = Mild depression, 10-14 = Moderate depression, 15-19 = Moderately severe depression, 20-27 = Severe depression   Psychosocial Evaluation and Intervention:   Psychosocial Re-Evaluation:   Psychosocial Discharge (Final Psychosocial Re-Evaluation):   Education: Education Goals: Education classes will be provided on a weekly basis, covering required topics. Participant will state understanding/return demonstration of topics presented.  Learning Barriers/Preferences:     Learning Barriers/Preferences - 05/24/17 1328      Learning Barriers/Preferences   Learning Barriers None   Learning Preferences None      Education Topics: Initial Evaluation Education: - Verbal,  written and demonstration of respiratory meds, RPE/PD scales, oximetry and breathing techniques. Instruction on use of nebulizers and MDIs: cleaning and proper use, rinsing mouth with steroid doses and importance of monitoring MDI activations.   Pulmonary Rehab from 05/24/2017 in [facility] Cardiac and Pulmonary Rehab  Date  05/24/17  Educator  [facility]  Instruction Review Code  2- meets goals/outcomes      General Nutrition Guidelines/Fats and Fiber: -Group instruction provided by verbal, written material, models and posters to present the general guidelines for heart healthy  nutrition. Gives an explanation and review of dietary fats and fiber.   Pulmonary Rehab from 05/24/2017 in [facility] Cardiac and Pulmonary Rehab  Date  05/24/17  Educator  [facility]  Instruction Review Code  2- meets goals/outcomes      Controlling Sodium/Reading Food Labels: -Group verbal and written material supporting the discussion of sodium use in heart healthy nutrition. Review and explanation with models, verbal and written materials for utilization of the food label.   Exercise Physiology & Risk Factors: - Group verbal and written instruction with models to review the exercise physiology of the cardiovascular system and associated critical values. Details cardiovascular disease risk factors and the goals associated with each risk factor.   Aerobic Exercise & Resistance Training: - Gives group verbal and written discussion on the health impact of inactivity. On the components of aerobic and resistive training programs and the benefits of this training and how to safely progress through these programs.   Flexibility, Balance, General Exercise Guidelines: - Provides group verbal and written instruction on the benefits of flexibility and balance training programs. Provides general exercise guidelines with specific guidelines to those with heart or lung disease. Demonstration and skill practice provided.   Stress Management: -  Provides group verbal and written instruction about the health risks of elevated stress, cause of high stress, and healthy ways to reduce stress.   Depression: - Provides group verbal and written instruction on the correlation between heart/lung disease and depressed mood, treatment options, and the stigmas associated with seeking treatment.   Exercise & Equipment Safety: - Individual verbal instruction and demonstration of equipment use and safety with use of the equipment.   Infection Prevention: - Provides verbal and written material to individual with discussion of infection control including proper hand washing and proper equipment cleaning during exercise session.   Pulmonary Rehab from 05/24/2017 in [facility] Cardiac and Pulmonary Rehab  Date  05/24/17  Educator  [facility]  Instruction Review Code  2- meets goals/outcomes      Falls Prevention: - Provides verbal and written material to individual with discussion of falls prevention and safety.   Pulmonary Rehab from 05/24/2017 in [facility] Cardiac and Pulmonary Rehab  Date  05/24/17  Educator  [facility]  Instruction Review Code  2- meets goals/outcomes      Diabetes: - Individual verbal and written instruction to review signs/symptoms of diabetes, desired ranges of glucose level fasting, after meals and with exercise. Advice that pre and post exercise glucose checks will be done for 3 sessions at entry of program.   Chronic Lung Diseases: - Group verbal and written instruction to review new updates, new respiratory medications, new advancements in procedures and treatments. Provide informative websites and "800" numbers of self-education.   Lung Procedures: - Group verbal and written instruction to describe testing methods done to diagnose lung disease. Review the outcome of test results. Describe the treatment choices: Pulmonary Function Tests, ABGs and oximetry.   Energy Conservation: - Provide group verbal and written instruction for  methods to conserve energy, plan and organize activities. Instruct on pacing techniques, use of adaptive equipment and posture/positioning to relieve shortness of breath.   Triggers: - Group verbal and written instruction to review types of environmental controls: home humidity, furnaces, filters, dust mite/pet prevention, HEPA vacuums. To discuss weather changes, air quality and the benefits of nasal washing.   Exacerbations: - Group verbal and written instruction to provide: warning signs, infection symptoms, calling MD promptly, preventive modes, and value of vaccinations. Review: effective  airway clearance, coughing and/or vibration techniques. Create an Sport and exercise psychologist.   Oxygen: - Individual and group verbal and written instruction on oxygen therapy. Includes supplement oxygen, available portable oxygen systems, continuous and intermittent flow rates, oxygen safety, concentrators, and Medicare reimbursement for oxygen.   Respiratory Medications: - Group verbal and written instruction to review medications for lung disease. Drug class, frequency, complications, importance of spacers, rinsing mouth after steroid MDI's, and proper cleaning methods for nebulizers.   Pulmonary Rehab from 05/24/2017 in [facility] Cardiac and Pulmonary Rehab  Date  05/24/17  Educator  [facility]  Instruction Review Code  2- meets goals/outcomes      AED/CPR: - Group verbal and written instruction with the use of models to demonstrate the basic use of the AED with the basic ABC's of resuscitation.   Breathing Retraining: - Provides individuals verbal and written instruction on purpose, frequency, and proper technique of diaphragmatic breathing and pursed-lipped breathing. Applies individual practice skills.   Pulmonary Rehab from 05/24/2017 in [facility] Cardiac and Pulmonary Rehab  Date  05/24/17  Educator  [facility]  Instruction Review Code  2- meets goals/outcomes      Anatomy and Physiology of the Lungs: - Group verbal and  written instruction with the use of models to provide basic lung anatomy and physiology related to function, structure and complications of lung disease.   Heart Failure: - Group verbal and written instruction on the basics of heart failure: signs/symptoms, treatments, explanation of ejection fraction, enlarged heart and cardiomyopathy.   Sleep Apnea: - Individual verbal and written instruction to review Obstructive Sleep Apnea. Review of risk factors, methods for diagnosing and types of masks and machines for OSA.   Anxiety: - Provides group, verbal and written instruction on the correlation between heart/lung disease and anxiety, treatment options, and management of anxiety.   Relaxation: - Provides group, verbal and written instruction about the benefits of relaxation for patients with heart/lung disease. Also provides patients with examples of relaxation techniques.   Knowledge Questionnaire Score:     Knowledge Questionnaire Score - 05/24/17 1329      Knowledge Questionnaire Score   Pre Score 7/10       Core Components/Risk Factors/Patient Goals at Admission:     Personal Goals and Risk Factors at Admission - 05/24/17 1320      Core Components/Risk Factors/Patient Goals on Admission    Weight Management Yes;Obesity   Intervention Weight Management: Develop a combined nutrition and exercise program designed to reach desired caloric intake, while maintaining appropriate intake of nutrient and fiber, sodium and fats, and appropriate energy expenditure required for the weight goal.;Weight Management: Provide education and appropriate resources to help participant work on and attain dietary goals.;Weight Management/Obesity: Establish reasonable short term and long term weight goals.;Obesity: Provide education and appropriate resources to help participant work on and attain dietary goals.   Admit Weight 217 lb 1.6 oz (98.5 kg)   Goal Weight: Short Term 212 lb (96.2 kg)   Goal  Weight: Long Term 180 lb (81.6 kg)   Expected Outcomes Short Term: Continue to assess and modify interventions until short term weight is achieved;Long Term: Adherence to nutrition and physical activity/exercise program aimed toward attainment of established weight goal;Weight Maintenance: Understanding of the daily nutrition guidelines, which includes 25-35% calories from fat, 7% or less cal from saturated fats, less than 200mg  cholesterol, less than 1.5gm of sodium, & 5 or more servings of fruits and vegetables daily;Weight Loss: Understanding of general recommendations for a balanced deficit meal  plan, which promotes 1-2 lb weight loss per week and includes a negative energy balance of [id number] kcal/d;Understanding recommendations for meals to include 15-35% energy as protein, 25-35% energy from fat, 35-60% energy from carbohydrates, less than 200mg  of dietary cholesterol, 20-35 gm of total fiber daily;Understanding of distribution of calorie intake throughout the day with the consumption of 4-5 meals/snacks   Tobacco Cessation --  patient does not smoke    Improve shortness of breath with ADL's Yes   Intervention Provide education, individualized exercise plan and daily activity instruction to help decrease symptoms of SOB with activities of daily living.   Expected Outcomes Short Term: Achieves a reduction of symptoms when performing activities of daily living.   Develop more efficient breathing techniques such as purse lipped breathing and diaphragmatic breathing; and practicing self-pacing with activity Yes   Intervention Provide education, demonstration and support about specific breathing techniuqes utilized for more efficient breathing. Include techniques such as pursed lipped breathing, diaphragmatic breathing and self-pacing activity.   Expected Outcomes Short Term: Participant will be able to demonstrate and use breathing techniques as needed throughout daily activities.   Increase knowledge  of respiratory medications and ability to use respiratory devices properly  Yes   Intervention Provide education and demonstration as needed of appropriate use of medications, inhalers, and oxygen therapy.   Expected Outcomes Short Term: Achieves understanding of medications use. Understands that oxygen is a medication prescribed by physician. Demonstrates appropriate use of inhaler and oxygen therapy.   Diabetes Yes  Pre diabetic   Intervention Provide education about signs/symptoms and action to take for hypo/hyperglycemia.;Provide education about proper nutrition, including hydration, and aerobic/resistive exercise prescription along with prescribed medications to achieve blood glucose in normal ranges: Fasting glucose 65-99 mg/dL   Expected Outcomes Short Term: Participant verbalizes understanding of the signs/symptoms and immediate care of hyper/hypoglycemia, proper foot care and importance of medication, aerobic/resistive exercise and nutrition plan for blood glucose control.;Long Term: Attainment of HbA1C < 7%.   Heart Failure Yes   Intervention Provide a combined exercise and nutrition program that is supplemented with education, support and counseling about heart failure. Directed toward relieving symptoms such as shortness of breath, decreased exercise tolerance, and extremity edema.   Expected Outcomes Improve functional capacity of life;Short term: Attendance in program 2-3 days a week with increased exercise capacity. Reported lower sodium intake. Reported increased fruit and vegetable intake. Reports medication compliance.;Short term: Daily weights obtained and reported for increase. Utilizing diuretic protocols set by physician.;Long term: Adoption of self-care skills and reduction of barriers for early signs and symptoms recognition and intervention leading to self-care maintenance.   Lipids Yes   Intervention Provide education and support for participant on nutrition & aerobic/resistive  exercise along with prescribed medications to achieve LDL 70mg , HDL >40mg .   Expected Outcomes Short Term: Participant states understanding of desired cholesterol values and is compliant with medications prescribed. Participant is following exercise prescription and nutrition guidelines.;Long Term: Cholesterol controlled with medications as prescribed, with individualized exercise RX and with personalized nutrition plan. Value goals: LDL < 70mg , HDL > 40 mg.      Core Components/Risk Factors/Patient Goals Review:    Core Components/Risk Factors/Patient Goals at Discharge (Final Review):    ITP Comments:     ITP Comments    Row Name 05/24/17 1315 05/30/17 0836         ITP Comments Medical evaluation completed. [facility] Initial ITP sent to Dr. Bethann PunchesMark Miller  director of [facility] for review and changes. 30 day review completed ITP sent to Dr. Daniel NonesBert Klein for Dr. Bethann PunchesMark Miller Director of [facility]. Continue with ITP unless changes are made by physician. New to program has not started sessions.         Comments:

## 2017-06-01 ENCOUNTER — Encounter: Payer: Medicare Other | Attending: Specialist | Admitting: *Deleted

## 2017-06-01 DIAGNOSIS — J449 Chronic obstructive pulmonary disease, unspecified: Secondary | ICD-10-CM | POA: Insufficient documentation

## 2017-06-01 DIAGNOSIS — Z7982 Long term (current) use of aspirin: Secondary | ICD-10-CM | POA: Insufficient documentation

## 2017-06-01 DIAGNOSIS — Z87891 Personal history of nicotine dependence: Secondary | ICD-10-CM | POA: Insufficient documentation

## 2017-06-01 DIAGNOSIS — I2510 Atherosclerotic heart disease of native coronary artery without angina pectoris: Secondary | ICD-10-CM | POA: Diagnosis not present

## 2017-06-01 DIAGNOSIS — E039 Hypothyroidism, unspecified: Secondary | ICD-10-CM | POA: Insufficient documentation

## 2017-06-01 DIAGNOSIS — E785 Hyperlipidemia, unspecified: Secondary | ICD-10-CM | POA: Diagnosis not present

## 2017-06-01 DIAGNOSIS — Z79899 Other long term (current) drug therapy: Secondary | ICD-10-CM | POA: Diagnosis not present

## 2017-06-01 DIAGNOSIS — R7309 Other abnormal glucose: Secondary | ICD-10-CM | POA: Insufficient documentation

## 2017-06-01 LAB — GLUCOSE, CAPILLARY
GLUCOSE-CAPILLARY: 138 mg/dL — AB (ref 65–99)
GLUCOSE-CAPILLARY: 90 mg/dL (ref 65–99)

## 2017-06-01 NOTE — Progress Notes (Signed)
Daily Session Note  Patient Details  Name: Claire Cortez MRN: [id number] Date of Birth: 31-Jan-1934 Referring Provider:     Pulmonary Rehab from 05/24/2017 in [facility] Cardiac and Pulmonary Rehab  Referring Provider  Ancil Linsey MD      Encounter Date: 06/01/2017  Check In:     Session Check In - 06/01/17 1127      Check-In   Location [facility]-Cardiac & Pulmonary Rehab   Staff Present Alberteen Sam, MA, ACSM RCEP, Exercise Physiologist;Joseph Alcus Dad, RN BSN   Supervising physician immediately available to respond to emergencies [facility] immediately available ER MD   Physician(s) Dr. Mable Paris and Jacqualine Code   Medication changes reported     No   Fall or balance concerns reported    No   Warm-up and Cool-down Performed as group-led instruction   Resistance Training Performed Yes   VAD Patient? No     Pain Assessment   Currently in Pain? No/denies         History  Smoking Status  . Former Smoker  . Packs/day: 1.50  . Years: 15.00  . Types: Cigarettes  . Quit date: 08/25/1974  Smokeless Tobacco  . Never Used    Comment: patient has not smoked since 1975    Goals Met:  Proper associated with RPD/PD & O2 Sat Independence with exercise equipment Using PLB without cueing & demonstrates good technique Exercise tolerated well Strength training completed today  Goals Unmet:  Not Applicable  Comments: First full day of exercise!  Patient was oriented to gym and equipment including functions, settings, policies, and procedures.  Patient's individual exercise prescription and treatment plan were reviewed.  All starting workloads were established based on the results of the 6 minute walk test done at initial orientation visit.  The plan for exercise progression was also introduced and progression will be customized based on patient's performance and goals.    Dr. Emily Filbert is Medical Director for [place] and [facility]  Pulmonary Rehabilitation.

## 2017-06-03 ENCOUNTER — Encounter: Payer: Medicare Other | Admitting: *Deleted

## 2017-06-03 DIAGNOSIS — J449 Chronic obstructive pulmonary disease, unspecified: Secondary | ICD-10-CM | POA: Diagnosis not present

## 2017-06-03 LAB — GLUCOSE, CAPILLARY: Glucose-Capillary: 123 mg/dL — ABNORMAL HIGH (ref 65–99)

## 2017-06-03 NOTE — Progress Notes (Signed)
Daily Session Note  Patient Details  Name: Claire Cortez MRN: [id number] Date of Birth: 05-08-1934 Referring Provider:     Pulmonary Rehab from 05/24/2017 in [facility] Cardiac and Pulmonary Rehab  Referring Provider  Ancil Linsey MD      Encounter Date: 06/03/2017  Check In:     Session Check In - 06/03/17 1139      Check-In   Location [facility]-Cardiac & Pulmonary Rehab   Staff Present Renita Papa, RN BSN;Joseph Darrin Nipper, [place], ACSM RCEP, Exercise Physiologist   Supervising physician immediately available to respond to emergencies [facility] immediately available ER MD   Physician(s) Dr. Alfred Levins and Jimmye Norman    Medication changes reported     No   Fall or balance concerns reported    No   Warm-up and Cool-down Performed as group-led instruction   Resistance Training Performed Yes   VAD Patient? No     Pain Assessment   Currently in Pain? No/denies         History  Smoking Status  . Former Smoker  . Packs/day: 1.50  . Years: 15.00  . Types: Cigarettes  . Quit date: 08/25/1974  Smokeless Tobacco  . Never Used    Comment: patient has not smoked since 1975    Goals Met:  Proper associated with RPD/PD & O2 Sat Independence with exercise equipment Using PLB without cueing & demonstrates good technique Exercise tolerated well Strength training completed today  Goals Unmet:  Not Applicable  Comments: Pt able to follow exercise prescription today without complaint.  Will continue to monitor for progression.     Dr. Emily Filbert is Medical Director for [place] and [facility].

## 2017-06-06 DIAGNOSIS — J449 Chronic obstructive pulmonary disease, unspecified: Secondary | ICD-10-CM

## 2017-06-06 LAB — GLUCOSE, CAPILLARY: Glucose-Capillary: 93 mg/dL (ref 65–99)

## 2017-06-06 NOTE — Progress Notes (Signed)
Daily Session Note  Patient Details  Name: Lluvia Gwynne MRN: [id number] Date of Birth: 27-Feb-1934 Referring Provider:     Pulmonary Rehab from 05/24/2017 in [facility] Cardiac and Pulmonary Rehab  Referring Provider  Ancil Linsey MD      Encounter Date: 06/06/2017  Check In:     Session Check In - 06/06/17 1155      Check-In   Location [facility]-Cardiac & Pulmonary Rehab   Staff Present Earlean Shawl, BS, ACSM CEP, Exercise Physiologist;Adalee Kathan Clayborn Bigness, BS, RRT, Respiratory Therapist   Supervising physician immediately available to respond to emergencies [facility] immediately available ER MD   Physician(s) Dr. Kerman Passey and Jimmye Norman   Medication changes reported     No   Fall or balance concerns reported    No   Warm-up and Cool-down Performed as group-led instruction   Resistance Training Performed Yes   VAD Patient? No     Pain Assessment   Currently in Pain? No/denies   Multiple Pain Sites No         History  Smoking Status  . Former Smoker  . Packs/day: 1.50  . Years: 15.00  . Types: Cigarettes  . Quit date: 08/25/1974  Smokeless Tobacco  . Never Used    Comment: patient has not smoked since 1975    Goals Met:  Proper associated with RPD/PD & O2 Sat Independence with exercise equipment Exercise tolerated well Strength training completed today  Goals Unmet:  Not Applicable  Comments: Pt able to follow exercise prescription today without complaint.  Will continue to monitor for progression.   Dr. Emily Filbert is Medical Director for [place] and [facility].

## 2017-06-08 ENCOUNTER — Encounter: Payer: Medicare Other | Admitting: *Deleted

## 2017-06-08 DIAGNOSIS — J449 Chronic obstructive pulmonary disease, unspecified: Secondary | ICD-10-CM | POA: Diagnosis not present

## 2017-06-08 NOTE — Progress Notes (Signed)
Daily Session Note  Patient Details  Name: Claire Cortez MRN: [id number] Date of Birth: May 10, 1934 Referring Provider:     Pulmonary Rehab from 05/24/2017 in [facility] Cardiac and Pulmonary Rehab  Referring Provider  Ancil Linsey MD      Encounter Date: 06/08/2017  Check In:     Session Check In - 06/08/17 1139      Check-In   Location [facility]-Cardiac & Pulmonary Rehab   Staff Present Heath Lark, RN, BSN, CCRP;Meredith Sherryll Burger, RN BSN;Joseph Flavia Shipper   Supervising physician immediately available to respond to emergencies [facility] immediately available ER MD   Physician(s) Dr. Jimmye Norman and Archie Balboa   Medication changes reported     No   Fall or balance concerns reported    No   Warm-up and Cool-down Performed as group-led instruction   Resistance Training Performed Yes   VAD Patient? No     Pain Assessment   Currently in Pain? No/denies         History  Smoking Status  . Former Smoker  . Packs/day: 1.50  . Years: 15.00  . Types: Cigarettes  . Quit date: 08/25/1974  Smokeless Tobacco  . Never Used    Comment: patient has not smoked since 1975    Goals Met:  Proper associated with RPD/PD & O2 Sat Independence with exercise equipment Using PLB without cueing & demonstrates good technique Exercise tolerated well Strength training completed today  Goals Unmet:  Not Applicable  Comments: Pt able to follow exercise prescription today without complaint.  Will continue to monitor for progression.    Dr. Emily Filbert is Medical Director for [place] and [facility].

## 2017-06-10 DIAGNOSIS — J449 Chronic obstructive pulmonary disease, unspecified: Secondary | ICD-10-CM

## 2017-06-10 NOTE — Progress Notes (Signed)
Daily Session Note  Patient Details  Name: Claire Cortez MRN: [id number] Date of Birth: Aug 24, 1934 Referring Provider:     Pulmonary Rehab from 05/24/2017 in [facility] Cardiac and Pulmonary Rehab  Referring Provider  Ancil Linsey MD      Encounter Date: 06/10/2017  Check In:     Session Check In - 06/10/17 1128      Check-In   Location [facility]-Cardiac & Pulmonary Rehab   Staff Present Alberteen Sam, MA, ACSM RCEP, Exercise Physiologist;Meredith Sherryll Burger, RN BSN;Rhydian Baldi Flavia Shipper   Supervising physician immediately available to respond to emergencies [facility] immediately available ER MD   Physician(s) Drs. Quale and Kinner   Medication changes reported     No   Fall or balance concerns reported    No   Warm-up and Cool-down Performed as group-led Location manager Performed Yes   VAD Patient? No     Pain Assessment   Currently in Pain? No/denies   Multiple Pain Sites No         History  Smoking Status  . Former Smoker  . Packs/day: 1.50  . Years: 15.00  . Types: Cigarettes  . Quit date: 08/25/1974  Smokeless Tobacco  . Never Used    Comment: patient has not smoked since 1975    Goals Met:  Proper associated with RPD/PD & O2 Sat Exercise tolerated well Personal goals reviewed No report of cardiac concerns or symptoms Strength training completed today  Goals Unmet:  Not Applicable  Comments: Pt able to follow exercise prescription today without complaint.  Will continue to monitor for progression.  Reviewed home exercise with pt today.  Pt plans to walk and use stationary bike at home for exercise.  Bobbi also attends an arthritis and balance class on MWF.  Reviewed THR, pulse, RPE, sign and symptoms, and when to call 911 or MD.  Also discussed weather considerations and indoor options.  Pt voiced understanding.   Dr. Emily Filbert is Medical Director for [place] and [facility] Pulmonary [facility].

## 2017-06-13 DIAGNOSIS — J449 Chronic obstructive pulmonary disease, unspecified: Secondary | ICD-10-CM | POA: Diagnosis not present

## 2017-06-13 NOTE — Progress Notes (Signed)
Daily Session Note  Patient Details  Name: Faline Langer MRN: [id number] Date of Birth: 01-22-1934 Referring Provider:     Pulmonary Rehab from 05/24/2017 in [facility] Cardiac and Pulmonary Rehab  Referring Provider  Ancil Linsey MD      Encounter Date: 06/13/2017  Check In:     Session Check In - 06/13/17 1211      Check-In   Location [facility]-Cardiac & Pulmonary Rehab   Staff Present Nada Maclachlan, BA, ACSM CEP, Exercise Physiologist;Kelly Amedeo Plenty, BS, ACSM CEP, Exercise Physiologist;Vincenza Dail Flavia Shipper   Supervising physician immediately available to respond to emergencies [facility] immediately available ER MD   Physician(s) Patient has slight episode of dizziness and has already talked to her ear doctor.   Medication changes reported     No   Fall or balance concerns reported    No   Warm-up and Cool-down Performed as group-led instruction   Resistance Training Performed Yes   VAD Patient? No     Pain Assessment   Currently in Pain? No/denies   Multiple Pain Sites No         History  Smoking Status  . Former Smoker  . Packs/day: 1.50  . Years: 15.00  . Types: Cigarettes  . Quit date: 08/25/1974  Smokeless Tobacco  . Never Used    Comment: patient has not smoked since 1975    Goals Met:  Proper associated with RPD/PD & O2 Sat Independence with exercise equipment Exercise tolerated well No report of cardiac concerns or symptoms Strength training completed today  Goals Unmet:  Not Applicable  Comments: Pt able to follow exercise prescription today without complaint.  Will continue to monitor for progression.   Dr. Emily Filbert is Medical Director for [place] and [facility].

## 2017-06-15 ENCOUNTER — Encounter: Payer: Medicare Other | Admitting: *Deleted

## 2017-06-15 DIAGNOSIS — J449 Chronic obstructive pulmonary disease, unspecified: Secondary | ICD-10-CM

## 2017-06-15 NOTE — Progress Notes (Signed)
Daily Session Note  Patient Details  Name: Claire Cortez MRN: [id number] Date of Birth: 13-Aug-1934 Referring Provider:     Pulmonary Rehab from 05/24/2017 in [facility] Cardiac and Pulmonary Rehab  Referring Provider  Ancil Linsey MD      Encounter Date: 06/15/2017  Check In:     Session Check In - 06/15/17 1131      Check-In   Location [facility]-Cardiac & Pulmonary Rehab   Staff Present Renita Papa, RN BSN;Joseph Darrin Nipper, [place], ACSM RCEP, Exercise Physiologist   Supervising physician immediately available to respond to emergencies [facility] immediately available ER MD   Physician(s) Dr. Jimmye Norman and Quentin Cornwall   Medication changes reported     No   Fall or balance concerns reported    No   Warm-up and Cool-down Performed as group-led instruction   Resistance Training Performed Yes   VAD Patient? No     Pain Assessment   Currently in Pain? No/denies           Exercise Prescription Changes - 06/14/17 1500      Response to Exercise   Blood Pressure (Admit) 130/72   Blood Pressure (Exercise) 140/70   Blood Pressure (Exit) 132/82   Heart Rate (Admit) 96 bpm   Heart Rate (Exercise) 114 bpm   Heart Rate (Exit) 84 bpm   Oxygen Saturation (Admit) 95 %   Oxygen Saturation (Exercise) 93 %   Oxygen Saturation (Exit) 93 %   Rating of Perceived Exertion (Exercise) 12   Perceived Dyspnea (Exercise) 1   Symptoms none   Duration Continue with 45 min of aerobic exercise without signs/symptoms of physical distress.   Intensity THRR unchanged     Progression   Progression Continue to progress workloads to maintain intensity without signs/symptoms of physical distress.   Average METs 2.83     Resistance Training   Training Prescription Yes   Weight 3 lbs   Reps 10-15     Interval Training   Interval Training No     Treadmill   MPH 1.3   Grade 0   Minutes 15   METs 12     NuStep   Level 2   SPM 84   Minutes 15   METs 2.7     REL-XR   Level 1   Speed 51   Minutes 15   METs 3.8     Home Exercise Plan   Plans to continue exercise at Home (comment)  walking, stationary bike, arthritis and balance class   Frequency Add 1 additional day to program exercise sessions.   Initial Home Exercises Provided 06/10/17      History  Smoking Status  . Former Smoker  . Packs/day: 1.50  . Years: 15.00  . Types: Cigarettes  . Quit date: 08/25/1974  Smokeless Tobacco  . Never Used    Comment: patient has not smoked since 1975    Goals Met:  Proper associated with RPD/PD & O2 Sat Independence with exercise equipment Using PLB without cueing & demonstrates good technique Exercise tolerated well Strength training completed today  Goals Unmet:  Not Applicable  Comments: Pt able to follow exercise prescription today without complaint.  Will continue to monitor for progression.    Dr. Emily Filbert is Medical Director for [place] and [facility].

## 2017-06-17 ENCOUNTER — Encounter: Payer: Medicare Other | Admitting: *Deleted

## 2017-06-17 DIAGNOSIS — J449 Chronic obstructive pulmonary disease, unspecified: Secondary | ICD-10-CM | POA: Diagnosis not present

## 2017-06-17 NOTE — Progress Notes (Signed)
Daily Session Note  Patient Details  Name: Dayanara Sherrill MRN: [id number] Date of Birth: 02-Aug-1934 Referring Provider:     Pulmonary Rehab from 05/24/2017 in [facility] Cardiac and Pulmonary Rehab  Referring Provider  Ancil Linsey MD      Encounter Date: 06/17/2017  Check In:     Session Check In - 06/17/17 1150      Check-In   Location [facility]-Cardiac & Pulmonary Rehab   Staff Present Justin Mend Naida Sleight, RN BSN;Meredith Sherryll Burger, RN BSN   Supervising physician immediately available to respond to emergencies [facility] immediately available ER MD   Physician(s) Dr. Jimmye Norman and Quentin Cornwall   Medication changes reported     No   Fall or balance concerns reported    No   Warm-up and Cool-down Performed as group-led instruction   Resistance Training Performed Yes   VAD Patient? No     Pain Assessment   Currently in Pain? No/denies         History  Smoking Status  . Former Smoker  . Packs/day: 1.50  . Years: 15.00  . Types: Cigarettes  . Quit date: 08/25/1974  Smokeless Tobacco  . Never Used    Comment: patient has not smoked since 1975    Goals Met:  Proper associated with RPD/PD & O2 Sat Independence with exercise equipment Using PLB without cueing & demonstrates good technique Exercise tolerated well Strength training completed today  Goals Unmet:  Not Applicable  Comments: Pt able to follow exercise prescription today without complaint.  Will continue to monitor for progression.    Dr. Emily Filbert is Medical Director for [place] and [facility].

## 2017-06-20 DIAGNOSIS — J449 Chronic obstructive pulmonary disease, unspecified: Secondary | ICD-10-CM

## 2017-06-20 NOTE — Progress Notes (Signed)
Daily Session Note  Patient Details  Name: Claire Cortez MRN: [id number] Date of Birth: [birth year] Referring Provider:     Pulmonary Rehab from 05/24/2017 in [facility] Cardiac and Pulmonary Rehab  Referring Provider  Ancil Linsey MD      Encounter Date: 06/20/2017  Check In:     Session Check In - 06/20/17 1140      Check-In   Location [facility]-Cardiac & Pulmonary Rehab   Staff Present Earlean Shawl, BS, ACSM CEP, Exercise Physiologist;Laureen Owens Shark, BS, RRT, Respiratory Therapist;Elizette Shek Flavia Shipper   Supervising physician immediately available to respond to emergencies [facility] immediately available ER MD   Physician(s) Dr. Mable Paris and Jimmye Norman   Medication changes reported     No   Fall or balance concerns reported    No   Warm-up and Cool-down Performed as group-led instruction   Resistance Training Performed Yes   VAD Patient? No     Pain Assessment   Currently in Pain? No/denies   Multiple Pain Sites No         History  Smoking Status  . Former Smoker  . Packs/day: 1.50  . Years: 15.00  . Types: Cigarettes  . Quit date: 08/25/1974  Smokeless Tobacco  . Never Used    Comment: patient has not smoked since 1975    Goals Met:  Proper associated with RPD/PD & O2 Sat Exercise tolerated well No report of cardiac concerns or symptoms Strength training completed today  Personal goals reviewed  Goals Unmet:  Not Applicable  Comments: Pt able to follow exercise prescription today without complaint.  Will continue to monitor for progression.   Dr. Emily Filbert is Medical Director for [place] and [facility].

## 2017-06-22 DIAGNOSIS — J449 Chronic obstructive pulmonary disease, unspecified: Secondary | ICD-10-CM

## 2017-06-22 NOTE — Progress Notes (Signed)
Daily Session Note  Patient Details  Name: Claire Cortez MRN: [id number] Date of Birth: 11/22/1933 Referring Provider:     Pulmonary Rehab from 05/24/2017 in [facility] Cardiac and Pulmonary Rehab  Referring Provider  Ancil Linsey MD      Encounter Date: 06/22/2017  Check In:     Session Check In - 06/22/17 1133      Check-In   Location [facility]-Cardiac & Pulmonary Rehab   Staff Present Alberteen Sam, MA, ACSM RCEP, Exercise Physiologist;Ramell Wacha Alcus Dad, RN BSN   Supervising physician immediately available to respond to emergencies [facility] immediately available ER MD   Physician(s) Dr. Jimmye Norman and Mariea Clonts   Medication changes reported     No   Fall or balance concerns reported    No   Warm-up and Cool-down Performed as group-led instruction   Resistance Training Performed Yes   VAD Patient? No     Pain Assessment   Currently in Pain? No/denies   Multiple Pain Sites No         History  Smoking Status  . Former Smoker  . Packs/day: 1.50  . Years: 15.00  . Types: Cigarettes  . Quit date: 08/25/1974  Smokeless Tobacco  . Never Used    Comment: patient has not smoked since 1975    Goals Met:  Proper associated with RPD/PD & O2 Sat Independence with exercise equipment Exercise tolerated well No report of cardiac concerns or symptoms Strength training completed today  Goals Unmet:  Not Applicable  Comments: Pt able to follow exercise prescription today without complaint.  Will continue to monitor for progression.   Dr. Emily Filbert is Medical Director for [place] and [facility].

## 2017-06-24 DIAGNOSIS — J449 Chronic obstructive pulmonary disease, unspecified: Secondary | ICD-10-CM

## 2017-06-24 NOTE — Progress Notes (Signed)
Daily Session Note  Patient Details  Name: Claire Cortez MRN: [id number] Date of Birth: [birth year] Referring Provider:     Pulmonary Rehab from 05/24/2017 in [facility] Cardiac and Pulmonary Rehab  Referring Provider  Ancil Linsey MD      Encounter Date: 06/24/2017  Check In:     Session Check In - 06/24/17 1151      Check-In   Location [facility]-Cardiac & Pulmonary Rehab   Staff Present Alberteen Sam, MA, ACSM RCEP, Exercise Physiologist;Mubashir Mallek Alcus Dad, RN BSN   Supervising physician immediately available to respond to emergencies [facility] immediately available ER MD   Physician(s) Dr. Burlene Arnt and Alfred Levins   Medication changes reported     No   Fall or balance concerns reported    No   Warm-up and Cool-down Performed as group-led instruction   Resistance Training Performed Yes   VAD Patient? No     Pain Assessment   Currently in Pain? No/denies   Multiple Pain Sites No         History  Smoking Status  . Former Smoker  . Packs/day: 1.50  . Years: 15.00  . Types: Cigarettes  . Quit date: 08/25/1974  Smokeless Tobacco  . Never Used    Comment: patient has not smoked since 1975    Goals Met:  Proper associated with RPD/PD & O2 Sat Independence with exercise equipment Exercise tolerated well No report of cardiac concerns or symptoms Strength training completed today  Goals Unmet:  Not Applicable  Comments: Pt able to follow exercise prescription today without complaint.  Will continue to monitor for progression.   Dr. Emily Filbert is Medical Director for [place] and [facility].

## 2017-06-27 DIAGNOSIS — J449 Chronic obstructive pulmonary disease, unspecified: Secondary | ICD-10-CM

## 2017-06-27 NOTE — Progress Notes (Signed)
Daily Session Note  Patient Details  Name: Claire Cortez MRN: [id number] Date of Birth: 10/20/1934 Referring Provider:     Pulmonary Rehab from 05/24/2017 in [facility] Cardiac and Pulmonary Rehab  Referring Provider  Ancil Linsey MD      Encounter Date: 06/27/2017  Check In:     Session Check In - 06/27/17 1149      Check-In   Location [facility]-Cardiac & Pulmonary Rehab   Staff Present Nada Maclachlan, BA, ACSM CEP, Exercise Physiologist;Kelly Amedeo Plenty, BS, ACSM CEP, Exercise Physiologist;Ellanor Feuerstein Flavia Shipper   Supervising physician immediately available to respond to emergencies [facility] immediately available ER MD   Physician(s) Dr. Mable Paris and Jimmye Norman   Medication changes reported     No   Fall or balance concerns reported    No   Warm-up and Cool-down Performed as group-led instruction   Resistance Training Performed Yes   VAD Patient? No     Pain Assessment   Currently in Pain? No/denies   Multiple Pain Sites No         History  Smoking Status  . Former Smoker  . Packs/day: 1.50  . Years: 15.00  . Types: Cigarettes  . Quit date: 08/25/1974  Smokeless Tobacco  . Never Used    Comment: patient has not smoked since 1975    Goals Met:  Proper associated with RPD/PD & O2 Sat Independence with exercise equipment Exercise tolerated well No report of cardiac concerns or symptoms Strength training completed today  Goals Unmet:  Not Applicable  Comments: Pt able to follow exercise prescription today without complaint.  Will continue to monitor for progression.   Dr. Emily Filbert is Medical Director for [place] and [facility].

## 2017-06-27 NOTE — Progress Notes (Signed)
Pulmonary Individual Treatment Plan  Patient Details  Name: Claire Cortez MRN: [id number] Date of Birth: [birth year] Referring Provider:     Pulmonary Rehab from 05/24/2017 in [facility] Cardiac and Pulmonary Rehab  Referring Provider  Ancil Linsey MD      Initial Encounter Date:    Pulmonary Rehab from 05/24/2017 in [facility] Cardiac and Pulmonary Rehab  Date  05/24/17  Referring Provider  Ancil Linsey MD      Visit Diagnosis: Chronic obstructive pulmonary disease, unspecified COPD type ([place])  Patient's Home Medications on Admission:  Current Outpatient Prescriptions:  .  amitriptyline (ELAVIL) 25 MG tablet, Take by mouth., Disp: , Rfl:  .  aspirin EC 81 MG tablet, Take by mouth., Disp: , Rfl:  .  CO ENZYME Q-10 PO, Take 200 mg by mouth., Disp: , Rfl:  .  conjugated estrogens (PREMARIN) vaginal cream, Apply blueberry sized amount to vaginal opening with finger tip M-W-F nights., Disp: 42.5 g, Rfl: 12 .  Glucosamine HCl (SM GLUCOSAMINE HCL) 1500 MG TABS, Take by mouth., Disp: , Rfl:  .  levothyroxine (SYNTHROID, LEVOTHROID) 88 MCG tablet, TAKE 1 TABLET DAILY ON AN EMPTY STOMACH WITH A GLASS OF WATER AT LEAST 30 TO 60 MINUTES BEFORE BREAKFAST, Disp: , Rfl:  .  lisinopril (PRINIVIL,ZESTRIL) 10 MG tablet, TAKE 1 TABLET DAILY, Disp: , Rfl:  .  Multiple Vitamin (MULTI-VITAMINS) TABS, Take by mouth., Disp: , Rfl:  .  Omega-3 Fatty Acids (FISH OIL) 1200 MG CAPS, Take by mouth., Disp: , Rfl:  .  rosuvastatin (CRESTOR) 10 MG tablet, TAKE 1 TABLET NIGHTLY, Disp: , Rfl:  .  solifenacin (VESICARE) 5 MG tablet, Take 1 tablet (5 mg total) by mouth daily., Disp: 30 tablet, Rfl: 3 .  traMADol (ULTRAM) 50 MG tablet, Take by mouth., Disp: , Rfl:   Past Medical History: Past Medical History:  Diagnosis Date  . Acquired hypothyroidism 08/25/2015  . Arteriosclerosis of coronary artery 08/25/2015   Overview:  Sp cabg x3 with graft stent 2007   . Arthritis of knee, degenerative 04/10/2015  . Benign  essential HTN 10/24/2014  . Borderline diabetes mellitus 08/25/2015  . Chronic cervical pain 08/25/2015  . Chronic diarrhea 09/24/2014  . Combined fat and carbohydrate induced hyperlipemia 10/24/2014    Tobacco Use: History  Smoking Status  . Former Smoker  . Packs/day: 1.50  . Years: 15.00  . Types: Cigarettes  . Quit date: 08/25/1974  Smokeless Tobacco  . Never Used    Comment: patient has not smoked since 1975    Labs: Recent Review Flowsheet Data    There is no flowsheet data to display.       Pulmonary Assessment Scores:     Pulmonary Assessment Scores    Row Name 05/24/17 1334         ADL UCSD   ADL Phase Entry     SOB Score total 32     Rest 1     Walk 2     Stairs 3     Bath 0     Dress 0     Shop 3       mMRC Score   mMRC Score 1        Pulmonary Function Assessment:     Pulmonary Function Assessment - 05/24/17 1330      Pulmonary Function Tests   FVC% 76 %   FEV1% 76 %   FEV1/FVC Ratio 72     Breath   Bilateral Breath Sounds Clear  Shortness of Breath Fear of Shortness of Breath      Exercise Target Goals:    Exercise Program Goal: Individual exercise prescription set with THRR, safety & activity barriers. Participant demonstrates ability to understand and report RPE using BORG scale, to self-measure pulse accurately, and to acknowledge the importance of the exercise prescription.  Exercise Prescription Goal: Starting with aerobic activity 30 plus minutes a day, 3 days per week for initial exercise prescription. Provide home exercise prescription and guidelines that participant acknowledges understanding prior to discharge.  Activity Barriers & Risk Stratification:     Activity Barriers & Cardiac Risk Stratification - 05/24/17 1341      Activity Barriers & Cardiac Risk Stratification   Activity Barriers Right Knee Replacement;Deconditioning;Muscular Weakness;Shortness of Breath      6 Minute Walk:     6 Minute Walk     Row Name 05/24/17 1343         6 Minute Walk   Phase Initial     Distance 970 feet     Walk Time 4.98 minutes     # of Rest Breaks 2     MPH 2.21     METS 7.92     RPE 13     Perceived Dyspnea  2     VO2 Peak 6.72     Symptoms Yes (comment)     Comments leg fatigue, compensating for Knee, SOB     Resting HR 93 bpm     Resting BP 134/56     Max Ex. HR 140 bpm     Max Ex. BP 162/70     2 Minute Post BP 154/72       Interval HR   Baseline HR (retired) 93     1 Minute HR 117     2 Minute HR 130     3 Minute HR 140     4 Minute HR 138     5 Minute HR 134     6 Minute HR 130     2 Minute Post HR 102     Interval Heart Rate? Yes       Interval Oxygen   Interval Oxygen? Yes     Baseline Oxygen Saturation % 92 %     Resting Liters of Oxygen 0 L  Room Air     1 Minute Oxygen Saturation % 92 %     1 Minute Liters of Oxygen 0 L     2 Minute Oxygen Saturation % 91 %     2 Minute Liters of Oxygen 0 L     3 Minute Oxygen Saturation % 90 %     3 Minute Liters of Oxygen 0 L     4 Minute Oxygen Saturation % 90 %     4 Minute Liters of Oxygen 0 L     5 Minute Oxygen Saturation % 93 %     5 Minute Liters of Oxygen 0 L     6 Minute Oxygen Saturation % 92 %     6 Minute Liters of Oxygen 0 L     2 Minute Post Oxygen Saturation % 94 %     2 Minute Post Liters of Oxygen 0 L       Oxygen Initial Assessment:     Oxygen Initial Assessment - 05/24/17 1326      Home Oxygen   Home Oxygen Device Home Concentrator   Sleep Oxygen Prescription Continuous   Liters per minute 2  Home Exercise Oxygen Prescription None   Home at Rest Exercise Oxygen Prescription None   Compliance with Home Oxygen Use Yes     Initial 6 min Walk   Oxygen Used None     Program Oxygen Prescription   Program Oxygen Prescription None     Intervention   Short Term Goals To learn and understand importance of monitoring SPO2 with pulse oximeter and demonstrate accurate use of the pulse oximeter.;To Learn  and understand importance of maintaining oxygen saturations>88%;To learn and demonstrate proper purse lipped breathing techniques or other breathing techniques.;To learn and demonstrate proper use of respiratory medications   Long  Term Goals Maintenance of O2 saturations>88%;Compliance with respiratory medication;Demonstrates proper use of MDI's;Exhibits proper breathing techniques, such as purse lipped breathing or other method taught during program session;Verbalizes importance of monitoring SPO2 with pulse oximeter and return demonstration      Oxygen Re-Evaluation:     Oxygen Re-Evaluation    Row Name 06/01/17 1421 06/20/17 1311           Program Oxygen Prescription   Program Oxygen Prescription  - None        Home Oxygen   Home Oxygen Device  - Home Concentrator;E-Tanks      Sleep Oxygen Prescription  - Continuous      Liters per minute  - 2      Home Exercise Oxygen Prescription  - None      Home at Rest Exercise Oxygen Prescription  - None      Compliance with Home Oxygen Use  - Yes        Goals/Expected Outcomes   Short Term Goals To learn and demonstrate proper purse lipped breathing techniques or other breathing techniques. To learn and exhibit compliance with exercise, home and travel O2 prescription;To learn and understand importance of monitoring SPO2 with pulse oximeter and demonstrate accurate use of the pulse oximeter.;To Learn and understand importance of maintaining oxygen saturations>88%;To learn and demonstrate proper purse lipped breathing techniques or other breathing techniques.;To learn and demonstrate proper use of respiratory medications      Long  Term Goals Exhibits proper breathing techniques, such as purse lipped breathing or other method taught during program session Exhibits compliance with exercise, home and travel O2 prescription;Maintenance of O2 saturations>88%;Compliance with respiratory medication;Verbalizes importance of monitoring SPO2 with  pulse oximeter and return demonstration;Exhibits proper breathing techniques, such as purse lipped breathing or other method taught during program session;Demonstrates proper use of MDI's      Comments Reviewed pursed lip breathing technique with Claire Cortez today.  Worked on how to use technique and reviewed how it is helpful in controlling breath and maintain oxygen saturations.   Reviewed PLB and when to use it to control SOB. She monitors Sats and home and maintains O2 above 90. She is complient with oxygen use at night. Dr. stated if she loses weight she may be able to come off oxygen at night.       Goals/Expected Outcomes Short: Become more proficient at PLB.  Long: Become independent at using PLB. Short: continue to use PLB idependenly during daily activities. Long: weight loss that will possibly allow her to come off oxygen at night.          Oxygen Discharge (Final Oxygen Re-Evaluation):     Oxygen Re-Evaluation - 06/20/17 1311      Program Oxygen Prescription   Program Oxygen Prescription None     Home Oxygen   Home Oxygen Device Home Concentrator;E-Tanks  Sleep Oxygen Prescription Continuous   Liters per minute 2   Home Exercise Oxygen Prescription None   Home at Rest Exercise Oxygen Prescription None   Compliance with Home Oxygen Use Yes     Goals/Expected Outcomes   Short Term Goals To learn and exhibit compliance with exercise, home and travel O2 prescription;To learn and understand importance of monitoring SPO2 with pulse oximeter and demonstrate accurate use of the pulse oximeter.;To Learn and understand importance of maintaining oxygen saturations>88%;To learn and demonstrate proper purse lipped breathing techniques or other breathing techniques.;To learn and demonstrate proper use of respiratory medications   Long  Term Goals Exhibits compliance with exercise, home and travel O2 prescription;Maintenance of O2 saturations>88%;Compliance with respiratory medication;Verbalizes  importance of monitoring SPO2 with pulse oximeter and return demonstration;Exhibits proper breathing techniques, such as purse lipped breathing or other method taught during program session;Demonstrates proper use of MDI's   Comments Reviewed PLB and when to use it to control SOB. She monitors Sats and home and maintains O2 above 90. She is complient with oxygen use at night. Dr. stated if she loses weight she may be able to come off oxygen at night.    Goals/Expected Outcomes Short: continue to use PLB idependenly during daily activities. Long: weight loss that will possibly allow her to come off oxygen at night.       Initial Exercise Prescription:     Initial Exercise Prescription - 05/24/17 1400      Date of Initial Exercise RX and Referring Provider   Date 05/24/17   Referring Provider Ancil Linsey MD     Treadmill   MPH 1.2   Grade 0   Minutes 15   METs 1.92     NuStep   Level 1   SPM 80   Minutes 15   METs 2     REL-XR   Level 1   Speed 50   Minutes 15   METs 2     Prescription Details   Frequency (times per week) 3   Duration Progress to 45 minutes of aerobic exercise without signs/symptoms of physical distress     Intensity   THRR 40-80% of Max Heartrate 111-130   Ratings of Perceived Exertion 11-13   Perceived Dyspnea 0-4     Progression   Progression Continue to progress workloads to maintain intensity without signs/symptoms of physical distress.     Resistance Training   Training Prescription Yes   Weight 3 lbs   Reps 10-15      Perform Capillary Blood Glucose checks as needed.  Exercise Prescription Changes:     Exercise Prescription Changes    Row Name 06/01/17 1400 06/10/17 1200 06/14/17 1500         Response to Exercise   Blood Pressure (Admit) 116/70  - 130/72     Blood Pressure (Exercise) 154/84  - 140/70     Blood Pressure (Exit) 120/64  - 132/82     Heart Rate (Admit) 102 bpm  - 96 bpm     Heart Rate (Exercise) 114 bpm  - 114  bpm     Heart Rate (Exit) 99 bpm  - 84 bpm     Oxygen Saturation (Admit) 93 %  - 95 %     Oxygen Saturation (Exercise) 93 %  - 93 %     Oxygen Saturation (Exit) 94 %  - 93 %     Rating of Perceived Exertion (Exercise) 12  - 12  Perceived Dyspnea (Exercise) 2  - 1     Symptoms none  - none     Comments first full day of exercise  -  -     Duration Progress to 45 minutes of aerobic exercise without signs/symptoms of physical distress  - Continue with 45 min of aerobic exercise without signs/symptoms of physical distress.     Intensity THRR unchanged  - THRR unchanged       Progression   Progression Continue to progress workloads to maintain intensity without signs/symptoms of physical distress.  - Continue to progress workloads to maintain intensity without signs/symptoms of physical distress.     Average METs 3.15  - 2.83       Resistance Training   Training Prescription Yes  - Yes     Weight 3 lbs  - 3 lbs     Reps 10-15  - 10-15       Interval Training   Interval Training No  - No       Treadmill   MPH  -  - 1.3     Grade  -  - 0     Minutes  -  - 15     METs  -  - 12       NuStep   Level 1  - 2     SPM 93  - 84     Minutes 15  - 15     METs 2.7  - 2.7       REL-XR   Level 1  - 1     Speed 53  - 51     Minutes 15  - 15     METs 3.6  - 3.8       Home Exercise Plan   Plans to continue exercise at  - Home (comment)  walking, stationary bike, arthritis and balance class Home (comment)  walking, stationary bike, arthritis and balance class     Frequency  - Add 1 additional day to program exercise sessions. Add 1 additional day to program exercise sessions.     Initial Home Exercises Provided  - 06/10/17 06/10/17        Exercise Comments:     Exercise Comments    Row Name 06/01/17 1415 06/03/17 1227         Exercise Comments First full day of exercise!  Patient was oriented to gym and equipment including functions, settings, policies, and procedures.   Patient's individual exercise prescription and treatment plan were reviewed.  All starting workloads were established based on the results of the 6 minute walk test done at initial orientation visit.  The plan for exercise progression was also introduced and progression will be customized based on patient's performance and goals. Claire Cortez did 4 min intervals on the treadmill today.  She will work up to the full 15 min!         Exercise Goals and Review:     Exercise Goals    Row Name 05/24/17 1407             Exercise Goals   Increase Physical Activity Yes       Intervention Provide advice, education, support and counseling about physical activity/exercise needs.;Develop an individualized exercise prescription for aerobic and resistive training based on initial evaluation findings, risk stratification, comorbidities and participant's personal goals.       Expected Outcomes Achievement of increased cardiorespiratory fitness and enhanced flexibility, muscular endurance and strength shown through measurements  of functional capacity and personal statement of participant.       Increase Strength and Stamina Yes       Intervention Provide advice, education, support and counseling about physical activity/exercise needs.;Develop an individualized exercise prescription for aerobic and resistive training based on initial evaluation findings, risk stratification, comorbidities and participant's personal goals.       Expected Outcomes Achievement of increased cardiorespiratory fitness and enhanced flexibility, muscular endurance and strength shown through measurements of functional capacity and personal statement of participant.          Exercise Goals Re-Evaluation :     Exercise Goals Re-Evaluation    Row Name 06/01/17 1415 06/10/17 1245 06/14/17 1458 06/20/17 1253       Exercise Goal Re-Evaluation   Exercise Goals Review Increase Physical Activity;Increase Strenth and Stamina Increase Physical  Activity;Increase Strenth and Stamina Increase Physical Activity;Increase Strenth and Stamina Increase Physical Activity;Increase Strenth and Stamina    Comments Claire Cortez completed her first full day of exercise today.  She had also attended her arthristis and balance class this morning.  We talked about how it may initially be too much for her to handle and we will see how it goes doing both classes together.  She also mentioned that she has a recumbent bike at home. We will continue to monitor her progression. Reviewed home exercise with pt today.  Pt plans to walk and use stationary bike at home for exercise.  Claire Cortez also attends an arthritis and balance class on MWF.  Reviewed THR, pulse, RPE, sign and symptoms, and when to call 911 or MD.  Also discussed weather considerations and indoor options.  Pt voiced understanding. Claire Cortez is off to a great start in rehab.  She has worked her way up to 15 continuous on the treadmill!!  She is also up to 3.8 METs on the XR.  We will continue to monitor her progression.  Claire Cortez has increased her level on the NS from 2 to 3 and her speed on the TM from 1.2-1.7 mph since starting program. She stated that she feels stronger and is able to do most of what she wants to. Claire Cortez also exercises independently at the senior center.    Expected Outcomes Short: Come to [facility] routinely.  Long: Build up strength and stamina.  Short: Add in an extra day of home exercise in two weeks.  Long: Continue to increase physical activity.  Short: Move up on XR and add incline to treadmill.  Long: Exercise independently some at home. Short: Make increases on the XR and add elevation to TM. (added this increase for next exercise session Long: Consistantly attend pulmonary rehab and exercise at senior center. Aim for 4-5 days a week of exercise.        Discharge Exercise Prescription (Final Exercise Prescription Changes):     Exercise Prescription Changes - 06/14/17 1500      Response to  Exercise   Blood Pressure (Admit) 130/72   Blood Pressure (Exercise) 140/70   Blood Pressure (Exit) 132/82   Heart Rate (Admit) 96 bpm   Heart Rate (Exercise) 114 bpm   Heart Rate (Exit) 84 bpm   Oxygen Saturation (Admit) 95 %   Oxygen Saturation (Exercise) 93 %   Oxygen Saturation (Exit) 93 %   Rating of Perceived Exertion (Exercise) 12   Perceived Dyspnea (Exercise) 1   Symptoms none   Duration Continue with 45 min of aerobic exercise without signs/symptoms of physical distress.   Intensity THRR unchanged  Progression   Progression Continue to progress workloads to maintain intensity without signs/symptoms of physical distress.   Average METs 2.83     Resistance Training   Training Prescription Yes   Weight 3 lbs   Reps 10-15     Interval Training   Interval Training No     Treadmill   MPH 1.3   Grade 0   Minutes 15   METs 12     NuStep   Level 2   SPM 84   Minutes 15   METs 2.7     REL-XR   Level 1   Speed 51   Minutes 15   METs 3.8     Home Exercise Plan   Plans to continue exercise at Home (comment)  walking, stationary bike, arthritis and balance class   Frequency Add 1 additional day to program exercise sessions.   Initial Home Exercises Provided 06/10/17      Nutrition:  Target Goals: Understanding of nutrition guidelines, daily intake of sodium <1575m, cholesterol <2030m calories 30% from fat and 7% or less from saturated fats, daily to have 5 or more servings of fruits and vegetables.  Biometrics:     Pre Biometrics - 05/24/17 1407      Pre Biometrics   Height 5' 4.8" (1.646 m)   Weight 217 lb 1.6 oz (98.5 kg)   Waist Circumference 40 inches   Hip Circumference 50.5 inches   Waist to Hip Ratio 0.79 %   BMI (Calculated) 36.4       Nutrition Therapy Plan and Nutrition Goals:   Nutrition Discharge: Rate Your Plate Scores:   Nutrition Goals Re-Evaluation:     Nutrition Goals Re-Evaluation    [place] 06/20/17 1304              Goals   Current Weight 218 lb (98.9 kg)       Comment Patient does not want to see dietician at this time, but reports that she does read food labels and tries to watch protion sizes. She aims to eat 6 small meals a day.           Nutrition Goals Discharge (Final Nutrition Goals Re-Evaluation):     Nutrition Goals Re-Evaluation - 06/20/17 1304      Goals   Current Weight 218 lb (98.9 kg)   Comment Patient does not want to see dietician at this time, but reports that she does read food labels and tries to watch protion sizes. She aims to eat 6 small meals a day.       Psychosocial: Target Goals: Acknowledge presence or absence of significant depression and/or stress, maximize coping skills, provide positive support system. Participant is able to verbalize types and ability to use techniques and skills needed for reducing stress and depression.   Initial Review & Psychosocial Screening:     Initial Psych Review & Screening - 05/24/17 1334      Initial Review   Current issues with None Identified     Family Dynamics   Good Support System? Yes     Barriers   Psychosocial barriers to participate in program There are no identifiable barriers or psychosocial needs.     Screening Interventions   Interventions Encouraged to exercise      Quality of Life Scores:     Quality of Life - 05/24/17 1337      Quality of Life Scores   Health/Function Pre 20.79 %   Socioeconomic Pre 23.33 %  Psych/Spiritual Pre 23.57 %   Family Pre 27.5 %   GLOBAL Pre 22.62 %      PHQ-9: Recent Review Flowsheet Data    Depression screen [facility] 2/9 05/24/2017   Decreased Interest 0   Down, Depressed, Hopeless 0   PHQ - 2 Score 0   Altered sleeping 2   Tired, decreased energy 2   Change in appetite 1   Feeling bad or failure about yourself  0   Trouble concentrating 0   Moving slowly or fidgety/restless 0   Suicidal thoughts 0   PHQ-9 Score 5   Difficult doing work/chores Not  difficult at all     Interpretation of Total Score  Total Score Depression Severity:  1-4 = Minimal depression, 5-9 = Mild depression, 10-14 = Moderate depression, 15-19 = Moderately severe depression, 20-27 = Severe depression   Psychosocial Evaluation and Intervention:     Psychosocial Evaluation - 06/08/17 1244      Psychosocial Evaluation & Interventions   Interventions Encouraged to exercise with the program and follow exercise prescription;Relaxation education;Stress management education   Comments Counselor met with Claire Cortez Claire Cortez) today for initial psychosocial evaluation.  She is an 83 year old who struggles with COPD.   Claire Cortez lives alone but has a daughter and her family who live locally and are in contact with her often.  Claire Cortez has had open heart surgery in the past and a knee replacement 13 months ago that she reports "still hurts."  She reports sleeping "not great" which has been going on for quite some time with difficulty getting to sleep initially - but getting approximately 6 hours when she does.  She denies a history of depression or anxiety or any current symptoms and states she is typically in a positive mood with minimal stress in her life.  Claire Cortez has goals to breathe better and lose some weight while in this program.  Staff will follow through the course of the program with Claire Cortez.   Expected Outcomes Claire Cortez will benefit from consistent exercise to achieve her stated goals.  Meeting with the dietician to address the weight loss goals will be helpful.  Staff will follow   Continue Psychosocial Services  Follow up required by staff      Psychosocial Re-Evaluation:     Psychosocial Re-Evaluation    [place] Name 06/20/17 1306             Psychosocial Re-Evaluation   Current issues with Current Sleep Concerns       Comments Patient reports she is still under little stress and has a positive attitude most of the time. Her only concern is that she has a hard time getting  to sleep and only sleeps about 6 hours a night (no change since last assessment)       Expected Outcomes Short: wants to try listening to soft music before bed to help her fall asleep. Long: establish better evening habits to help her fall asleep more consistantly        Continue Psychosocial Services  Follow up required by staff          Psychosocial Discharge (Final Psychosocial Re-Evaluation):     Psychosocial Re-Evaluation - 06/20/17 1306      Psychosocial Re-Evaluation   Current issues with Current Sleep Concerns   Comments Patient reports she is still under little stress and has a positive attitude most of the time. Her only concern is that she has a hard time getting to sleep and  only sleeps about 6 hours a night (no change since last assessment)   Expected Outcomes Short: wants to try listening to soft music before bed to help her fall asleep. Long: establish better evening habits to help her fall asleep more consistantly    Continue Psychosocial Services  Follow up required by staff      Education: Education Goals: Education classes will be provided on a weekly basis, covering required topics. Participant will state understanding/return demonstration of topics presented.  Learning Barriers/Preferences:     Learning Barriers/Preferences - 05/24/17 1328      Learning Barriers/Preferences   Learning Barriers None   Learning Preferences None      Education Topics: Initial Evaluation Education: - Verbal, written and demonstration of respiratory meds, RPE/PD scales, oximetry and breathing techniques. Instruction on use of nebulizers and MDIs: cleaning and proper use, rinsing mouth with steroid doses and importance of monitoring MDI activations.   Pulmonary Rehab from 06/22/2017 in [facility] Cardiac and Pulmonary Rehab  Date  05/24/17  Educator  [facility]  Instruction Review Code (retired)  2- meets goals/outcomes      General Nutrition Guidelines/Fats and Fiber: -Group instruction  provided by verbal, written material, models and posters to present the general guidelines for heart healthy nutrition. Gives an explanation and review of dietary fats and fiber.   Pulmonary Rehab from 06/22/2017 in [facility] Cardiac and Pulmonary Rehab  Date  06/20/17  Educator  CR  Instruction Review Code (retired)  2- meets goals/outcomes      Controlling Sodium/Reading Food Labels: -Group verbal and written material supporting the discussion of sodium use in heart healthy nutrition. Review and explanation with models, verbal and written materials for utilization of the food label.   Exercise Physiology & Risk Factors: - Group verbal and written instruction with models to review the exercise physiology of the cardiovascular system and associated critical values. Details cardiovascular disease risk factors and the goals associated with each risk factor.   Aerobic Exercise & Resistance Training: - Gives group verbal and written discussion on the health impact of inactivity. On the components of aerobic and resistive training programs and the benefits of this training and how to safely progress through these programs.   Flexibility, Balance, General Exercise Guidelines: - Provides group verbal and written instruction on the benefits of flexibility and balance training programs. Provides general exercise guidelines with specific guidelines to those with heart or lung disease. Demonstration and skill practice provided.   Pulmonary Rehab from 06/22/2017 in [facility] Cardiac and Pulmonary Rehab  Date  06/15/17  Educator  AS  Instruction Review Code (retired)  2- meets goals/outcomes      Stress Management: - Provides group verbal and written instruction about the health risks of elevated stress, cause of high stress, and healthy ways to reduce stress.   Depression: - Provides group verbal and written instruction on the correlation between heart/lung disease and depressed mood, treatment options,  and the stigmas associated with seeking treatment.   Pulmonary Rehab from 06/22/2017 in [facility] Cardiac and Pulmonary Rehab  Date  06/22/17  Educator  [facility]  Instruction Review Code (retired)  2- meets goals/outcomes      Exercise & Equipment Safety: - Individual verbal instruction and demonstration of equipment use and safety with use of the equipment.   Infection Prevention: - Provides verbal and written material to individual with discussion of infection control including proper hand washing and proper equipment cleaning during exercise session.   Pulmonary Rehab from 06/22/2017 in [facility]  Cardiac and Pulmonary Rehab  Date  05/24/17  Educator  [facility]  Instruction Review Code (retired)  2- meets [employer] Prevention: - Provides verbal and written material to individual with discussion of falls prevention and safety.   Pulmonary Rehab from 06/22/2017 in [facility] Cardiac and Pulmonary Rehab  Date  05/24/17  Educator  [facility]  Instruction Review Code (retired)  2- meets goals/outcomes      Diabetes: - Individual verbal and written instruction to review signs/symptoms of diabetes, desired ranges of glucose level fasting, after meals and with exercise. Advice that pre and post exercise glucose checks will be done for 3 sessions at entry of program.   Chronic Lung Diseases: - Group verbal and written instruction to review new updates, new respiratory medications, new advancements in procedures and treatments. Provide informative websites and "800" numbers of self-education.   Lung Procedures: - Group verbal and written instruction to describe testing methods done to diagnose lung disease. Review the outcome of test results. Describe the treatment choices: Pulmonary Function Tests, ABGs and oximetry.   Energy Conservation: - Provide group verbal and written instruction for methods to conserve energy, plan and organize activities. Instruct on pacing techniques, use of adaptive equipment  and posture/positioning to relieve shortness of breath.   Triggers: - Group verbal and written instruction to review types of environmental controls: home humidity, furnaces, filters, dust mite/pet prevention, HEPA vacuums. To discuss weather changes, air quality and the benefits of nasal washing.   Exacerbations: - Group verbal and written instruction to provide: warning signs, infection symptoms, calling MD promptly, preventive modes, and value of vaccinations. Review: effective airway clearance, coughing and/or vibration techniques. Create an Sports administrator.   Pulmonary Rehab from 06/22/2017 in [facility] Cardiac and Pulmonary Rehab  Date  06/01/17  Educator  [facility]  Instruction Review Code (retired)  2- meets goals/outcomes      Oxygen: - Individual and group verbal and written instruction on oxygen therapy. Includes supplement oxygen, available portable oxygen systems, continuous and intermittent flow rates, oxygen safety, concentrators, and Medicare reimbursement for oxygen.   Respiratory Medications: - Group verbal and written instruction to review medications for lung disease. Drug class, frequency, complications, importance of spacers, rinsing mouth after steroid MDI's, and proper cleaning methods for nebulizers.   Pulmonary Rehab from 06/22/2017 in [facility] Cardiac and Pulmonary Rehab  Date  05/24/17  Educator  [facility]  Instruction Review Code (retired)  2- meets goals/outcomes      AED/CPR: - Group verbal and written instruction with the use of models to demonstrate the basic use of the AED with the basic ABC's of resuscitation.   Breathing Retraining: - Provides individuals verbal and written instruction on purpose, frequency, and proper technique of diaphragmatic breathing and pursed-lipped breathing. Applies individual practice skills.   Pulmonary Rehab from 06/22/2017 in [facility] Cardiac and Pulmonary Rehab  Date  06/01/17  Educator  [facility]  Instruction Review Code (retired)  2- Architect and Physiology of the Lungs: - Group verbal and written instruction with the use of models to provide basic lung anatomy and physiology related to function, structure and complications of lung disease.   Anatomy & Physiology of the Heart: - Group verbal and written instruction and models provide basic cardiac anatomy and physiology, with the coronary electrical and arterial systems. Review of: AMI, Angina, Valve disease, Heart Failure, Cardiac Arrhythmia, Pacemakers, and the ICD.   Pulmonary Rehab from 06/22/2017 in [facility] Cardiac  and Pulmonary Rehab  Date  06/10/17  Educator  [facility]  Instruction Review Code (retired)  2- meets goals/outcomes      Heart Failure: - Group verbal and written instruction on the basics of heart failure: signs/symptoms, treatments, explanation of ejection fraction, enlarged heart and cardiomyopathy.   Pulmonary Rehab from 06/22/2017 in [facility] Cardiac and Pulmonary Rehab  Date  06/10/17  Educator  [facility]  Instruction Review Code (retired)  2- meets goals/outcomes      Sleep Apnea: - Individual verbal and written instruction to review Obstructive Sleep Apnea. Review of risk factors, methods for diagnosing and types of masks and machines for OSA.   Anxiety: - Provides group, verbal and written instruction on the correlation between heart/lung disease and anxiety, treatment options, and management of anxiety.   Relaxation: - Provides group, verbal and written instruction about the benefits of relaxation for patients with heart/lung disease. Also provides patients with examples of relaxation techniques.   Cardiac Medications: - Group verbal and written instruction to review commonly prescribed medications for heart disease. Reviews the medication, class of the drug, and side effects.   Know Your Numbers: -Group verbal and written instruction about important numbers in your health.  Review of Cholesterol, Blood Pressure, Diabetes, and BMI  and the role they play in your overall health.   Other: -Provides group and verbal instruction on various topics (see comments)    Knowledge Questionnaire Score:     Knowledge Questionnaire Score - 05/24/17 1329      Knowledge Questionnaire Score   Pre Score 7/10       Core Components/Risk Factors/Patient Goals at Admission:     Personal Goals and Risk Factors at Admission - 05/24/17 1320      Core Components/Risk Factors/Patient Goals on Admission    Weight Management Yes;Obesity   Intervention Weight Management: Develop a combined nutrition and exercise program designed to reach desired caloric intake, while maintaining appropriate intake of nutrient and fiber, sodium and fats, and appropriate energy expenditure required for the weight goal.;Weight Management: Provide education and appropriate resources to help participant work on and attain dietary goals.;Weight Management/Obesity: Establish reasonable short term and long term weight goals.;Obesity: Provide education and appropriate resources to help participant work on and attain dietary goals.   Admit Weight 217 lb 1.6 oz (98.5 kg)   Goal Weight: Short Term 212 lb (96.2 kg)   Goal Weight: Long Term 180 lb (81.6 kg)   Expected Outcomes Short Term: Continue to assess and modify interventions until short term weight is achieved;Long Term: Adherence to nutrition and physical activity/exercise program aimed toward attainment of established weight goal;Weight Maintenance: Understanding of the daily nutrition guidelines, which includes 25-35% calories from fat, 7% or less cal from saturated fats, less than '200mg'$  cholesterol, less than 1.5gm of sodium, & 5 or more servings of fruits and vegetables daily;Weight Loss: Understanding of general recommendations for a balanced deficit meal plan, which promotes 1-2 lb weight loss per week and includes a negative energy balance of [phone] kcal/d;Understanding recommendations for meals to include  15-35% energy as protein, 25-35% energy from fat, 35-60% energy from carbohydrates, less than '200mg'$  of dietary cholesterol, 20-35 gm of total fiber daily;Understanding of distribution of calorie intake throughout the day with the consumption of 4-5 meals/snacks   Tobacco Cessation --  patient does not smoke    Improve shortness of breath with ADL's Yes   Intervention Provide education, individualized exercise plan and daily activity instruction to help decrease symptoms of SOB  with activities of daily living.   Expected Outcomes Short Term: Achieves a reduction of symptoms when performing activities of daily living.   Develop more efficient breathing techniques such as purse lipped breathing and diaphragmatic breathing; and practicing self-pacing with activity Yes   Intervention Provide education, demonstration and support about specific breathing techniuqes utilized for more efficient breathing. Include techniques such as pursed lipped breathing, diaphragmatic breathing and self-pacing activity.   Expected Outcomes Short Term: Participant will be able to demonstrate and use breathing techniques as needed throughout daily activities.   Increase knowledge of respiratory medications and ability to use respiratory devices properly  Yes   Intervention Provide education and demonstration as needed of appropriate use of medications, inhalers, and oxygen therapy.   Expected Outcomes Short Term: Achieves understanding of medications use. Understands that oxygen is a medication prescribed by physician. Demonstrates appropriate use of inhaler and oxygen therapy.   Diabetes Yes  Pre diabetic   Intervention Provide education about signs/symptoms and action to take for hypo/hyperglycemia.;Provide education about proper nutrition, including hydration, and aerobic/resistive exercise prescription along with prescribed medications to achieve blood glucose in normal ranges: Fasting glucose 65-99 mg/dL   Expected  Outcomes Short Term: Participant verbalizes understanding of the signs/symptoms and immediate care of hyper/hypoglycemia, proper foot care and importance of medication, aerobic/resistive exercise and nutrition plan for blood glucose control.;Long Term: Attainment of HbA1C < 7%.   Heart Failure Yes   Intervention Provide a combined exercise and nutrition program that is supplemented with education, support and counseling about heart failure. Directed toward relieving symptoms such as shortness of breath, decreased exercise tolerance, and extremity edema.   Expected Outcomes Improve functional capacity of life;Short term: Attendance in program 2-3 days a week with increased exercise capacity. Reported lower sodium intake. Reported increased fruit and vegetable intake. Reports medication compliance.;Short term: Daily weights obtained and reported for increase. Utilizing diuretic protocols set by physician.;Long term: Adoption of self-care skills and reduction of barriers for early signs and symptoms recognition and intervention leading to self-care maintenance.   Lipids Yes   Intervention Provide education and support for participant on nutrition & aerobic/resistive exercise along with prescribed medications to achieve LDL '70mg'$ , HDL >'40mg'$ .   Expected Outcomes Short Term: Participant states understanding of desired cholesterol values and is compliant with medications prescribed. Participant is following exercise prescription and nutrition guidelines.;Long Term: Cholesterol controlled with medications as prescribed, with individualized exercise RX and with personalized nutrition plan. Value goals: LDL < '70mg'$ , HDL > 40 mg.      Core Components/Risk Factors/Patient Goals Review:      Goals and Risk Factor Review    Row Name 06/20/17 1300             Core Components/Risk Factors/Patient Goals Review   Personal Goals Review Weight Management/Obesity;Diabetes;Hypertension;Lipids       Review Patient  wants to continue to lose weight and is considered pre-diabetic. She recently had lab work done and the doctor told her the results were good.        Expected Outcomes Short: 1-2 lbs weight loss per week. Long: continue exercise programs and healthy dietary choices to control BP, blood sugars, and lipid panels.           Core Components/Risk Factors/Patient Goals at Discharge (Final Review):      Goals and Risk Factor Review - 06/20/17 1300      Core Components/Risk Factors/Patient Goals Review   Personal Goals Review Weight Management/Obesity;Diabetes;Hypertension;Lipids   Review Patient wants  to continue to lose weight and is considered pre-diabetic. She recently had lab work done and the doctor told her the results were good.    Expected Outcomes Short: 1-2 lbs weight loss per week. Long: continue exercise programs and healthy dietary choices to control BP, blood sugars, and lipid panels.       ITP Comments:     ITP Comments    Row Name 05/24/17 1315 05/30/17 0836 06/27/17 0843       ITP Comments Medical evaluation completed. Visit diagnosis can be found in [facility] encounter 05/24/17, Initial ITP sent to Dr. Emily Filbert director of [place] for review and changes. 30 day review completed ITP sent to Dr. Ramonita Lab for Dr. Emily Filbert Director of [place]. Continue with ITP unless changes are made by physician. New to program has not started sessions. 30 day review completed. ITP sent to Dr. Emily Filbert Director of [place]. Continue with ITP unless changes are made by physician.          Comments: 30 day review

## 2017-06-29 DIAGNOSIS — J449 Chronic obstructive pulmonary disease, unspecified: Secondary | ICD-10-CM

## 2017-06-29 NOTE — Progress Notes (Signed)
Daily Session Note  Patient Details  Name: Claire Cortez MRN: [id number] Date of Birth: 1934-01-22 Referring Provider:     Pulmonary Rehab from 05/24/2017 in [facility] Cardiac and Pulmonary Rehab  Referring Provider  Ancil Linsey MD      Encounter Date: 06/29/2017  Check In:     Session Check In - 06/29/17 1147      Check-In   Location [facility]-Cardiac & Pulmonary Rehab   Staff Present Alberteen Sam, MA, ACSM RCEP, Exercise Physiologist;Edgel Degnan Flavia Shipper   Supervising physician immediately available to respond to emergencies [facility] immediately available ER MD   Physician(s) Dr. Burlene Arnt and [facility]   Medication changes reported     No   Fall or balance concerns reported    No   Warm-up and Cool-down Performed as group-led instruction   Resistance Training Performed Yes   VAD Patient? No     Pain Assessment   Currently in Pain? No/denies   Multiple Pain Sites No         History  Smoking Status  . Former Smoker  . Packs/day: 1.50  . Years: 15.00  . Types: Cigarettes  . Quit date: 08/25/1974  Smokeless Tobacco  . Never Used    Comment: patient has not smoked since 1975    Goals Met:  Proper associated with RPD/PD & O2 Sat Independence with exercise equipment Exercise tolerated well No report of cardiac concerns or symptoms Strength training completed today  Goals Unmet:  Not Applicable  Comments: Pt able to follow exercise prescription today without complaint.  Will continue to monitor for progression.   Dr. Emily Filbert is Medical Director for [place] and [facility].

## 2017-07-01 DIAGNOSIS — J449 Chronic obstructive pulmonary disease, unspecified: Secondary | ICD-10-CM

## 2017-07-01 NOTE — Progress Notes (Signed)
Daily Session Note  Patient Details  Name: Claire Cortez MRN: [id number] Date of Birth: 02-25-1934 Referring Provider:     Pulmonary Rehab from 05/24/2017 in [facility] Cardiac and Pulmonary Rehab  Referring Provider  Ancil Linsey MD      Encounter Date: 07/01/2017  Check In:     Session Check In - 07/01/17 1220      Check-In   Location [facility]-Cardiac & Pulmonary Rehab   Staff Present Alberteen Sam, MA, ACSM RCEP, Exercise Physiologist;Amanda Oletta Darter, BA, ACSM CEP, Exercise Physiologist;Alexys Lobello Flavia Shipper   Supervising physician immediately available to respond to emergencies [facility] immediately available ER MD   Physician(s) Dr. Burlene Arnt and Alfred Levins   Medication changes reported     No   Fall or balance concerns reported    No   Warm-up and Cool-down Performed as group-led instruction   Resistance Training Performed Yes   VAD Patient? No     Pain Assessment   Currently in Pain? No/denies   Multiple Pain Sites No         History  Smoking Status  . Former Smoker  . Packs/day: 1.50  . Years: 15.00  . Types: Cigarettes  . Quit date: 08/25/1974  Smokeless Tobacco  . Never Used    Comment: patient has not smoked since 1975    Goals Met:  Proper associated with RPD/PD & O2 Sat Independence with exercise equipment Exercise tolerated well No report of cardiac concerns or symptoms Strength training completed today  Goals Unmet:  Not Applicable  Comments: Pt able to follow exercise prescription today without complaint.  Will continue to monitor for progression.   Dr. Emily Filbert is Medical Director for [place] and [facility].

## 2017-07-06 ENCOUNTER — Encounter: Payer: Medicare Other | Attending: Specialist | Admitting: *Deleted

## 2017-07-06 DIAGNOSIS — E785 Hyperlipidemia, unspecified: Secondary | ICD-10-CM | POA: Diagnosis not present

## 2017-07-06 DIAGNOSIS — Z7982 Long term (current) use of aspirin: Secondary | ICD-10-CM | POA: Diagnosis not present

## 2017-07-06 DIAGNOSIS — Z79899 Other long term (current) drug therapy: Secondary | ICD-10-CM | POA: Diagnosis not present

## 2017-07-06 DIAGNOSIS — J449 Chronic obstructive pulmonary disease, unspecified: Secondary | ICD-10-CM

## 2017-07-06 DIAGNOSIS — I2510 Atherosclerotic heart disease of native coronary artery without angina pectoris: Secondary | ICD-10-CM | POA: Insufficient documentation

## 2017-07-06 DIAGNOSIS — R7309 Other abnormal glucose: Secondary | ICD-10-CM | POA: Insufficient documentation

## 2017-07-06 DIAGNOSIS — Z87891 Personal history of nicotine dependence: Secondary | ICD-10-CM | POA: Diagnosis not present

## 2017-07-06 DIAGNOSIS — E039 Hypothyroidism, unspecified: Secondary | ICD-10-CM | POA: Diagnosis not present

## 2017-07-06 NOTE — Progress Notes (Signed)
Daily Session Note  Patient Details  Name: Claire Cortez MRN: [id number] Date of Birth: [birth year] Referring Provider:     Pulmonary Rehab from 05/24/2017 in [facility] Cardiac and Pulmonary Rehab  Referring Provider  Ancil Linsey MD      Encounter Date: 07/06/2017  Check In:     Session Check In - 07/06/17 1125      Check-In   Location [facility]-Cardiac & Pulmonary Rehab   Staff Present Alberteen Sam, MA, ACSM RCEP, Exercise Physiologist;Meredith Sherryll Burger, RN BSN;Joseph Flavia Shipper   Supervising physician immediately available to respond to emergencies [facility] immediately available ER MD   Physician(s) Drs. Karma Greaser and Archie Balboa   Medication changes reported     No   Fall or balance concerns reported    No   Warm-up and Cool-down Performed as group-led Location manager Performed Yes   VAD Patient? No     Pain Assessment   Currently in Pain? No/denies   Multiple Pain Sites No         History  Smoking Status  . Former Smoker  . Packs/day: 1.50  . Years: 15.00  . Types: Cigarettes  . Quit date: 08/25/1974  Smokeless Tobacco  . Never Used    Comment: patient has not smoked since 1975    Goals Met:  Proper associated with RPD/PD & O2 Sat Independence with exercise equipment Exercise tolerated well No report of cardiac concerns or symptoms Strength training completed today  Goals Unmet:  Not Applicable  Comments: Pt able to follow exercise prescription today without complaint.  Will continue to monitor for progression.    Dr. Emily Filbert is Medical Director for [place] and [facility].

## 2017-07-08 ENCOUNTER — Encounter: Payer: Medicare Other | Admitting: *Deleted

## 2017-07-08 DIAGNOSIS — J449 Chronic obstructive pulmonary disease, unspecified: Secondary | ICD-10-CM | POA: Diagnosis not present

## 2017-07-08 NOTE — Progress Notes (Signed)
Daily Session Note  Patient Details  Name: Claire Cortez MRN: [id number] Date of Birth: [birth year] Referring Provider:     Pulmonary Rehab from 05/24/2017 in [facility] Cardiac and Pulmonary Rehab  Referring Provider  Ancil Linsey MD      Encounter Date: 07/08/2017  Check In:     Session Check In - 07/08/17 1130      Check-In   Location [facility]-Cardiac & Pulmonary Rehab   Staff Present Alberteen Sam, MA, ACSM RCEP, Exercise Physiologist;Krista Frederico Hamman, RN BSN;Meredith Sherryll Burger, RN BSN   Supervising physician immediately available to respond to emergencies [facility] immediately available ER MD   Physician(s) Drs. Karma Greaser and [place]   Medication changes reported     No   Fall or balance concerns reported    No   Warm-up and Cool-down Performed as group-led Location manager Performed Yes   VAD Patient? No     Pain Assessment   Currently in Pain? No/denies   Multiple Pain Sites No         History  Smoking Status  . Former Smoker  . Packs/day: 1.50  . Years: 15.00  . Types: Cigarettes  . Quit date: 08/25/1974  Smokeless Tobacco  . Never Used    Comment: patient has not smoked since 1975    Goals Met:  Proper associated with RPD/PD & O2 Sat Independence with exercise equipment Using PLB without cueing & demonstrates good technique Exercise tolerated well Personal goals reviewed No report of cardiac concerns or symptoms Strength training completed today  Goals Unmet:  Not Applicable  Comments: Pt able to follow exercise prescription today without complaint.  Will continue to monitor for progression.    Dr. Emily Filbert is Medical Director for [place] and [facility].

## 2017-07-11 DIAGNOSIS — J449 Chronic obstructive pulmonary disease, unspecified: Secondary | ICD-10-CM

## 2017-07-11 NOTE — Progress Notes (Signed)
Daily Session Note  Patient Details  Name: Claire Cortez MRN: [id number] Date of Birth: 07/07/1934 Referring Provider:     Pulmonary Rehab from 05/24/2017 in [facility] Card[facility] and Pul[facility] Rehab  Referring Provider  Ancil Linsey MD      Encounter Date: 07/11/2017  Check In:     Session Check In - 07/11/17 1154      Check-In   Location [facility]-Cardiac & Pulmonary Rehab   Staff Present Nada Maclachlan, BA, ACSM CEP, Exercise Physiologist;Kelly Amedeo Plenty, BS, ACSM CEP, Exercise Physiologist;Dexter Signor Flavia Shipper   Supervising physician immediately available to respond to emergencies [facility] immediately available ER MD   Physician(s) Dr. Corky Downs and Jimmye Norman   Medication changes reported     No   Fall or balance concerns reported    No   Warm-up and Cool-down Performed as group-led instruction   Resistance Training Performed Yes   VAD Patient? No     Pain Assessment   Currently in Pain? No/denies   Multiple Pain Sites No         History  Smoking Status  . Former Smoker  . Packs/day: 1.50  . Years: 15.00  . Types: Cigarettes  . Quit date: 08/25/1974  Smokeless Tobacco  . Never Used    Comment: patient has not smoked since 1975    Goals Met:  Independence with exercise equipment Exercise tolerated well No report of cardiac concerns or symptoms Strength training completed today  Goals Unmet:  Not Applicable  Comments: Pt able to follow exercise prescription today without complaint.  Will continue to monitor for progression.   Dr. Emily Filbert is Medical Director for [place] and [facility].

## 2017-07-20 ENCOUNTER — Encounter: Payer: Medicare Other | Admitting: *Deleted

## 2017-07-20 DIAGNOSIS — J449 Chronic obstructive pulmonary disease, unspecified: Secondary | ICD-10-CM | POA: Diagnosis not present

## 2017-07-20 NOTE — Progress Notes (Signed)
Daily Session Note  Patient Details  Name: Claire Cortez MRN: [id number] Date of Birth: 1934-07-13 Referring Provider:     Pulmonary Rehab from 05/24/2017 in [facility] Cardiac and Pulmonary Rehab  Referring Provider  Ancil Linsey MD      Encounter Date: 07/20/2017  Check In:     Session Check In - 07/20/17 1126      Check-In   Location [facility]-Cardiac & Pulmonary Rehab   Staff Present Renita Papa, RN BSN;Joseph Darrin Nipper, [place], ACSM RCEP, Exercise Physiologist   Supervising physician immediately available to respond to emergencies [facility] immediately available ER MD   Physician(s) Dr. Joni Fears and Jimmye Norman   Medication changes reported     No   Fall or balance concerns reported    No   Warm-up and Cool-down Performed as group-led instruction   Resistance Training Performed Yes   VAD Patient? No     Pain Assessment   Currently in Pain? No/denies         History  Smoking Status  . Former Smoker  . Packs/day: 1.50  . Years: 15.00  . Types: Cigarettes  . Quit date: 08/25/1974  Smokeless Tobacco  . Never Used    Comment: patient has not smoked since 1975    Goals Met:  Proper associated with RPD/PD & O2 Sat Independence with exercise equipment Using PLB without cueing & demonstrates good technique Exercise tolerated well Strength training completed today  Goals Unmet:  Not Applicable  Comments: Pt able to follow exercise prescription today without complaint.  Will continue to monitor for progression.    Dr. Emily Filbert is Medical Director for [place] and [facility].

## 2017-07-22 ENCOUNTER — Encounter: Payer: Medicare Other | Admitting: *Deleted

## 2017-07-22 DIAGNOSIS — J449 Chronic obstructive pulmonary disease, unspecified: Secondary | ICD-10-CM | POA: Diagnosis not present

## 2017-07-22 NOTE — Progress Notes (Signed)
Daily Session Note  Patient Details  Name: Cadance Raus MRN: [id number] Date of Birth: 07-12-1934 Referring Provider:     Pulmonary Rehab from 05/24/2017 in [facility] Cardiac and Pulmonary Rehab  Referring Provider  Ancil Linsey MD      Encounter Date: 07/22/2017  Check In:     Session Check In - 07/22/17 1130      Check-In   Location [facility]-Cardiac & Pulmonary Rehab   Staff Present Renita Papa, RN BSN;Joseph Darrin Nipper, [place], ACSM RCEP, Exercise Physiologist   Supervising physician immediately available to respond to emergencies [facility] immediately available ER MD   Physician(s) Dr. Archie Balboa and Joni Fears    Medication changes reported     No   Fall or balance concerns reported    No   Warm-up and Cool-down Performed as group-led instruction   Resistance Training Performed Yes   VAD Patient? No     Pain Assessment   Currently in Pain? No/denies         History  Smoking Status  . Former Smoker  . Packs/day: 1.50  . Years: 15.00  . Types: Cigarettes  . Quit date: 08/25/1974  Smokeless Tobacco  . Never Used    Comment: patient has not smoked since 1975    Goals Met:  Proper associated with RPD/PD & O2 Sat Independence with exercise equipment Using PLB without cueing & demonstrates good technique Exercise tolerated well Strength training completed today  Goals Unmet:  Not Applicable  Comments: Pt able to follow exercise prescription today without complaint.  Will continue to monitor for progression.    Dr. Emily Filbert is Medical Director for [place] and [facility].

## 2017-07-25 DIAGNOSIS — J449 Chronic obstructive pulmonary disease, unspecified: Secondary | ICD-10-CM | POA: Diagnosis not present

## 2017-07-25 NOTE — Progress Notes (Signed)
Daily Session Note  Patient Details  Name: Claire Cortez MRN: [id number] Date of Birth: May 13, 1934 Referring Provider:     Pulmonary Rehab from 05/24/2017 in [facility] Cardiac and Pulmonary Rehab  Referring Provider  Ancil Linsey MD      Encounter Date: 07/25/2017  Check In:     Session Check In - 07/25/17 1151      Check-In   Location [facility]-Cardiac & Pulmonary Rehab   Staff Present Nada Maclachlan, BA, ACSM CEP, Exercise Physiologist;Kelly Amedeo Plenty, BS, ACSM CEP, Exercise Physiologist;Ellajane Stong Flavia Shipper   Supervising physician immediately available to respond to emergencies [facility] immediately available ER MD   Physician(s) Dr. Clearnce Hasten and [facility]   Medication changes reported     No   Fall or balance concerns reported    No   Warm-up and Cool-down Performed as group-led instruction   Resistance Training Performed Yes   VAD Patient? No     Pain Assessment   Currently in Pain? No/denies   Multiple Pain Sites No         History  Smoking Status  . Former Smoker  . Packs/day: 1.50  . Years: 15.00  . Types: Cigarettes  . Quit date: 08/25/1974  Smokeless Tobacco  . Never Used    Comment: patient has not smoked since 1975    Goals Met:  Independence with exercise equipment Exercise tolerated well No report of cardiac concerns or symptoms Strength training completed today  Goals Unmet:  Not Applicable  Comments: Pt able to follow exercise prescription today without complaint.  Will continue to monitor for progression.   Dr. Emily Filbert is Medical Director for [place] and [facility].

## 2017-07-25 NOTE — Progress Notes (Signed)
Pulmonary Individual Treatment Plan  Patient Details  Name: Kenny Stern MRN: [id number] Date of Birth: 1934/03/28 Referring Provider:     Pulmonary Rehab from 05/24/2017 in [facility]  Referring Provider  Ancil Linsey MD      Initial Encounter Date:    Pulmonary Rehab from 05/24/2017 in [facility] Re[facility]  Date  05/24/17  Referring Provider  Ancil Linsey MD      Visit Diagnosis: Chronic obstructive pulmonary disease, unspecified COPD type ([place])  Patient's Home Medications on Admission:  Current Outpatient Prescriptions:  .  amitriptyline (ELAVIL) 25 MG tablet, Take by mouth., Disp: , Rfl:  .  aspirin EC 81 MG tablet, Take by mouth., Disp: , Rfl:  .  CO ENZYME Q-10 PO, Take 200 mg by mouth., Disp: , Rfl:  .  conjugated estrogens (PREMARIN) vaginal cream, Apply blueberry sized amount to vaginal opening with finger tip M-W-F nights., Disp: 42.5 g, Rfl: 12 .  Glucosamine HCl (SM GLUCOSAMINE HCL) 1500 MG TABS, Take by mouth., Disp: , Rfl:  .  levothyroxine (SYNTHROID, LEVOTHROID) 88 MCG tablet, TAKE 1 TABLET DAILY ON AN EMPTY STOMACH WITH A GLASS OF WATER AT LEAST 30 TO 60 MINUTES BEFORE BREAKFAST, Disp: , Rfl:  .  lisinopril (PRINIVIL,ZESTRIL) 10 MG tablet, TAKE 1 TABLET DAILY, Disp: , Rfl:  .  Multiple Vitamin (MULTI-VITAMINS) TABS, Take by mouth., Disp: , Rfl:  .  Omega-3 Fatty Acids (FISH OIL) 1200 MG CAPS, Take by mouth., Disp: , Rfl:  .  rosuvastatin (CRESTOR) 10 MG tablet, TAKE 1 TABLET NIGHTLY, Disp: , Rfl:  .  solifenacin (VESICARE) 5 MG tablet, Take 1 tablet (5 mg total) by mouth daily., Disp: 30 tablet, Rfl: 3 .  traMADol (ULTRAM) 50 MG tablet, Take by mouth., Disp: , Rfl:   Past Medical History: Past Medical History:  Diagnosis Date  . Acquired hypothyroidism 08/25/2015  . Arteriosclerosis of coronary artery 08/25/2015   Overview:  Sp cabg x3 with graft stent 2007   . Arthritis of knee, degenerative 04/10/2015  . Benign  essential HTN 10/24/2014  . Borderline diabetes mellitus 08/25/2015  . Chronic cervical pain 08/25/2015  . Chronic diarrhea 09/24/2014  . Combined fat and carbohydrate induced hyperlipemia 10/24/2014    Tobacco Use: History  Smoking Status  . Former Smoker  . Packs/day: 1.50  . Years: 15.00  . Types: Cigarettes  . Quit date: 08/25/1974  Smokeless Tobacco  . Never Used    Comment: patient has not smoked since 1975    Labs: Recent Review Flowsheet Data    There is no flowsheet data to display.       Pulmonary Assessment Scores:     Pulmonary Assessment Scores    Row Name 05/24/17 1334 07/01/17 1254       ADL UCSD   ADL Phase Entry Mid    SOB Score total 32 41    Rest 1 1    Walk 2 2    Stairs 3 2    Bath 0 1    Dress 0 1    Shop 3 3      mMRC Score   mMRC Score 1  -       Pulmonary Function Assessment:     Pulmonary Function Assessment - 05/24/17 1330      Pulmonary Function Tests   FVC% 76 %   FEV1% 76 %   FEV1/FVC Ratio 72     Breath   Bilateral Breath Sounds Clear  Shortness of Breath Fear of Shortness of Breath      Exercise Target Goals:    Exercise Program Goal: Individual exercise prescription set with THRR, safety & activity barriers. Participant demonstrates ability to understand and report RPE using BORG scale, to self-measure pulse accurately, and to acknowledge the importance of the exercise prescription.  Exercise Prescription Goal: Starting with aerobic activity 30 plus minutes a day, 3 days per week for initial exercise prescription. Provide home exercise prescription and guidelines that participant acknowledges understanding prior to discharge.  Activity Barriers & Risk Stratification:     Activity Barriers & Cardiac Risk Stratification - 05/24/17 1341      Activity Barriers & Cardiac Risk Stratification   Activity Barriers Right Knee Replacement;Deconditioning;Muscular Weakness;Shortness of Breath      6 Minute  Walk:     6 Minute Walk    Row Name 05/24/17 1343         6 Minute Walk   Phase Initial     Distance 970 feet     Walk Time 4.98 minutes     # of Rest Breaks 2     MPH 2.21     METS 7.92     RPE 13     Perceived Dyspnea  2     VO2 Peak 6.72     Symptoms Yes (comment)     Comments leg fatigue, compensating for Knee, SOB     Resting HR 93 bpm     Resting BP 134/56     Max Ex. HR 140 bpm     Max Ex. BP 162/70     2 Minute Post BP 154/72       Interval HR   Baseline HR (retired) 93     1 Minute HR 117     2 Minute HR 130     3 Minute HR 140     4 Minute HR 138     5 Minute HR 134     6 Minute HR 130     2 Minute Post HR 102     Interval Heart Rate? Yes       Interval Oxygen   Interval Oxygen? Yes     Baseline Oxygen Saturation % 92 %     Resting Liters of Oxygen 0 L  Room Air     1 Minute Oxygen Saturation % 92 %     1 Minute Liters of Oxygen 0 L     2 Minute Oxygen Saturation % 91 %     2 Minute Liters of Oxygen 0 L     3 Minute Oxygen Saturation % 90 %     3 Minute Liters of Oxygen 0 L     4 Minute Oxygen Saturation % 90 %     4 Minute Liters of Oxygen 0 L     5 Minute Oxygen Saturation % 93 %     5 Minute Liters of Oxygen 0 L     6 Minute Oxygen Saturation % 92 %     6 Minute Liters of Oxygen 0 L     2 Minute Post Oxygen Saturation % 94 %     2 Minute Post Liters of Oxygen 0 L       Oxygen Initial Assessment:     Oxygen Initial Assessment - 05/24/17 1326      Home Oxygen   Home Oxygen Device Home Concentrator   Sleep Oxygen Prescription Continuous   Liters per minute 2  Home Exercise Oxygen Prescription None   Home at Rest Exercise Oxygen Prescription None   Compliance with Home Oxygen Use Yes     Initial 6 min Walk   Oxygen Used None     Program Oxygen Prescription   Program Oxygen Prescription None     Intervention   Short Term Goals To learn and understand importance of monitoring SPO2 with pulse oximeter and demonstrate accurate use of  the pulse oximeter.;To Learn and understand importance of maintaining oxygen saturations>88%;To learn and demonstrate proper purse lipped breathing techniques or other breathing techniques.;To learn and demonstrate proper use of respiratory medications   Long  Term Goals Maintenance of O2 saturations>88%;Compliance with respiratory medication;Demonstrates proper use of MDI's;Exhibits proper breathing techniques, such as purse lipped breathing or other method taught during program session;Verbalizes importance of monitoring SPO2 with pulse oximeter and return demonstration      Oxygen Re-Evaluation:     Oxygen Re-Evaluation    Row Name 06/01/17 1421 06/20/17 1311 07/08/17 1239         Program Oxygen Prescription   Program Oxygen Prescription  - None None       Home Oxygen   Home Oxygen Device  - Home Concentrator;E-Tanks  -     Sleep Oxygen Prescription  - Continuous Continuous     Liters per minute  - 2 2     Home Exercise Oxygen Prescription  - None None     Home at Rest Exercise Oxygen Prescription  - None None     Compliance with Home Oxygen Use  - Yes Yes       Goals/Expected Outcomes   Short Term Goals To learn and demonstrate proper purse lipped breathing techniques or other breathing techniques. To learn and exhibit compliance with exercise, home and travel O2 prescription;To learn and understand importance of monitoring SPO2 with pulse oximeter and demonstrate accurate use of the pulse oximeter.;To Learn and understand importance of maintaining oxygen saturations>88%;To learn and demonstrate proper purse lipped breathing techniques or other breathing techniques.;To learn and demonstrate proper use of respiratory medications To learn and understand importance of monitoring SPO2 with pulse oximeter and demonstrate accurate use of the pulse oximeter.;To learn and understand importance of maintaining oxygen saturations>88%;To learn and demonstrate proper use of respiratory  medications;To learn and demonstrate proper pursed lip breathing techniques or other breathing techniques.     Long  Term Goals Exhibits proper breathing techniques, such as purse lipped breathing or other method taught during program session Exhibits compliance with exercise, home and travel O2 prescription;Maintenance of O2 saturations>88%;Compliance with respiratory medication;Verbalizes importance of monitoring SPO2 with pulse oximeter and return demonstration;Exhibits proper breathing techniques, such as purse lipped breathing or other method taught during program session;Demonstrates proper use of MDI's Verbalizes importance of monitoring SPO2 with pulse oximeter and return demonstration;Exhibits proper breathing techniques, such as pursed lip breathing or other method taught during program session;Compliance with respiratory medication;Maintenance of O2 saturations>88%     Comments Reviewed pursed lip breathing technique with Bobbi today.  Worked on how to use technique and reviewed how it is helpful in controlling breath and maintain oxygen saturations.   Reviewed PLB and when to use it to control SOB. She monitors Sats and home and maintains O2 above 90. She is complient with oxygen use at night. Dr. stated if she loses weight she may be able to come off oxygen at night.  She is compliant with home medications and home O2 at night. She uses her pursed lip  breathing when she is SOB and has noticed how much it helps her.      Goals/Expected Outcomes Short: Become more proficient at PLB.  Long: Become independent at using PLB. Short: continue to use PLB idependenly during daily activities. Long: weight loss that will possibly allow her to come off oxygen at night.  Short: continue practicing PLB, using inhalers as needed, and home O2; Long: utilize these skills after [facility] is completed        Oxygen Discharge (Final Oxygen Re-Evaluation):     Oxygen Re-Evaluation - 07/08/17 1239      Program  Oxygen Prescription   Program Oxygen Prescription None     Home Oxygen   Sleep Oxygen Prescription Continuous   Liters per minute 2   Home Exercise Oxygen Prescription None   Home at Rest Exercise Oxygen Prescription None   Compliance with Home Oxygen Use Yes     Goals/Expected Outcomes   Short Term Goals To learn and understand importance of monitoring SPO2 with pulse oximeter and demonstrate accurate use of the pulse oximeter.;To learn and understand importance of maintaining oxygen saturations>88%;To learn and demonstrate proper use of respiratory medications;To learn and demonstrate proper pursed lip breathing techniques or other breathing techniques.   Long  Term Goals Verbalizes importance of monitoring SPO2 with pulse oximeter and return demonstration;Exhibits proper breathing techniques, such as pursed lip breathing or other method taught during program session;Compliance with respiratory medication;Maintenance of O2 saturations>88%   Comments She is compliant with home medications and home O2 at night. She uses her pursed lip breathing when she is SOB and has noticed how much it helps her.    Goals/Expected Outcomes Short: continue practicing PLB, using inhalers as needed, and home O2; Long: utilize these skills after [facility] is completed      Initial Exercise Prescription:     Initial Exercise Prescription - 05/24/17 1400      Date of Initial Exercise RX and Referring Provider   Date 05/24/17   Referring Provider Ancil Linsey MD     Treadmill   MPH 1.2   Grade 0   Minutes 15   METs 1.92     NuStep   Level 1   SPM 80   Minutes 15   METs 2     REL-XR   Level 1   Speed 50   Minutes 15   METs 2     Prescription Details   Frequency (times per week) 3   Duration Progress to 45 minutes of aerobic exercise without signs/symptoms of physical distress     Intensity   THRR 40-80% of Max Heartrate 111-130   Ratings of Perceived Exertion 11-13   Perceived  Dyspnea 0-4     Progression   Progression Continue to progress workloads to maintain intensity without signs/symptoms of physical distress.     Resistance Training   Training Prescription Yes   Weight 3 lbs   Reps 10-15      Perform Capillary Blood Glucose checks as needed.  Exercise Prescription Changes:     Exercise Prescription Changes    Row Name 06/01/17 1400 06/10/17 1200 06/14/17 1500 06/29/17 1400 07/12/17 1400     Response to Exercise   Blood Pressure (Admit) 116/70  - 130/72 134/66 122/70   Blood Pressure (Exercise) 154/84  - 140/70  -  -   Blood Pressure (Exit) 120/64  - 132/82 122/74 124/72   Heart Rate (Admit) 102 bpm  - 96 bpm 115 bpm 95 bpm  Heart Rate (Exercise) 114 bpm  - 114 bpm 134 bpm 117 bpm   Heart Rate (Exit) 99 bpm  - 84 bpm 101 bpm 94 bpm   Oxygen Saturation (Admit) 93 %  - 95 % 97 % 97 %   Oxygen Saturation (Exercise) 93 %  - 93 % 93 % 94 %   Oxygen Saturation (Exit) 94 %  - 93 % 92 % 94 %   Rating of Perceived Exertion (Exercise) 12  - _0 Perceived Dyspnea (Exercise) 2  - _1 Symptoms none  - none none none   Comments first full day of exercise  -  -  -  -   Duration Progress to 45 minutes of aerobic exercise without signs/symptoms of physical distress  - Continue with 45 min of aerobic exercise without signs/symptoms of physical distress. Continue with 45 min of aerobic exercise without signs/symptoms of physical distress. Continue with 45 min of aerobic exercise without signs/symptoms of physical distress.   Intensity THRR unchanged  - THRR unchanged THRR unchanged THRR unchanged     Progression   Progression Continue to progress workloads to maintain intensity without signs/symptoms of physical distress.  - Continue to progress workloads to maintain intensity without signs/symptoms of physical distress. Continue to progress workloads to maintain intensity without signs/symptoms of physical distress. Continue to progress workloads to  maintain intensity without signs/symptoms of physical distress.   Average METs 3.15  - 2.83 3.1 3.37     Resistance Training   Training Prescription Yes  - Yes Yes Yes   Weight 3 lbs  - 3 lbs 4 lbs 4 lbs  green band   Reps 10-15  - 10-15 10-15 10-15     Interval Training   Interval Training No  - No No No     Treadmill   MPH  -  - 1.3 1.7 1.7   Grade  -  - 0 0 0   Minutes  -  - _2 METs  -  - 12 2.3 2.3     NuStep   Level 1  - _3 SPM 93  - 84 89 68   Minutes 15  - _4 METs 2.7  - 2.7 2.9 3     REL-XR   Level 1  - _5 Speed 53  - 51 58 55   Minutes 15  - _6 METs 3.6  - 3.8 4.1 3.9     Home Exercise Plan   Plans to continue exercise at  - Home (comment)  walking, stationary bike, arthritis and balance class Home (comment)  walking, stationary bike, arthritis and balance class Home (comment)  walking, stationary bike, arthritis and balance class Home (comment)  walking, stationary bike, arthritis and balance class   Frequency  - Add 1 additional day to program exercise sessions. Add 1 additional day to program exercise sessions. Add 2 additional days to program exercise sessions. Add 2 additional days to program exercise sessions.   Initial Home Exercises Provided  - 06/10/17 06/10/17 06/10/17 06/10/17      Exercise Comments:     Exercise Comments    Row Name 06/01/17 1415 06/03/17 1227         Exercise Comments First full day of exercise!  Patient was oriented to gym and equipment including functions, settings, policies, and procedures.  Patient's individual  exercise prescription and treatment plan were reviewed.  All starting workloads were established based on the results of the 6 minute walk test done at initial orientation visit.  The plan for exercise progression was also introduced and progression will be customized based on patient's performance and goals. Bobbi did 4 min intervals on the treadmill today.  She will work up to the  full 15 min!         Exercise Goals and Review:     Exercise Goals    Row Name 05/24/17 1407             Exercise Goals   Increase Physical Activity Yes       Intervention Provide advice, education, support and counseling about physical activity/exercise needs.;Develop an individualized exercise prescription for aerobic and resistive training based on initial evaluation findings, risk stratification, comorbidities and participant's personal goals.       Expected Outcomes Achievement of increased cardiorespiratory fitness and enhanced flexibility, muscular endurance and strength shown through measurements of functional capacity and personal statement of participant.       Increase Strength and Stamina Yes       Intervention Provide advice, education, support and counseling about physical activity/exercise needs.;Develop an individualized exercise prescription for aerobic and resistive training based on initial evaluation findings, risk stratification, comorbidities and participant's personal goals.       Expected Outcomes Achievement of increased cardiorespiratory fitness and enhanced flexibility, muscular endurance and strength shown through measurements of functional capacity and personal statement of participant.          Exercise Goals Re-Evaluation :     Exercise Goals Re-Evaluation    Row Name 06/01/17 1415 06/10/17 1245 06/14/17 1458 06/20/17 1253 06/29/17 1430     Exercise Goal Re-Evaluation   Exercise Goals Review Increase Physical Activity;Increase Strenth and Stamina Increase Physical Activity;Increase Strenth and Stamina Increase Physical Activity;Increase Strenth and Stamina Increase Physical Activity;Increase Strenth and Stamina Increase Physical Activity;Increase Strength and Stamina   Comments Bobbi completed her first full day of exercise today.  She had also attended her arthristis and balance class this morning.  We talked about how it may initially be too much for  her to handle and we will see how it goes doing both classes together.  She also mentioned that she has a recumbent bike at home. We will continue to monitor her progression. Reviewed home exercise with pt today.  Pt plans to walk and use stationary bike at home for exercise.  Bobbi also attends an arthritis and balance class on MWF.  Reviewed THR, pulse, RPE, sign and symptoms, and when to call 911 or MD.  Also discussed weather considerations and indoor options.  Pt voiced understanding. Tammi Klippel is off to a great start in rehab.  She has worked her way up to 15 continuous on the treadmill!!  She is also up to 3.8 METs on the XR.  We will continue to monitor her progression.  Tammi Klippel has increased her level on the NS from 2 to 3 and her speed on the TM from 1.2-1.7 mph since starting program. She stated that she feels stronger and is able to do most of what she wants to. Bobbi also exercises independently at the senior center. Tammi Klippel is doing well in rehab.  She is now on level 3 on the XR and maintaining her 1.7 mph for the full 15 min on the treadmill.  She is enjoying class and still able to go do her balance and  arthritis class at the [facility].    Expected Outcomes Short: Come to [facility] routinely.  Long: Build up strength and stamina.  Short: Add in an extra day of home exercise in two weeks.  Long: Continue to increase physical activity.  Short: Move up on XR and add incline to treadmill.  Long: Exercise independently some at home. Short: Make increases on the XR and add elevation to TM. (added this increase for next exercise session Long: Consistantly attend pulmonary rehab and exercise at senior center. Aim for 4-5 days a week of exercise.  Short: Continue to work on increasing TM workloads.  Long: Continue to exercise daily.    [place] Name 07/12/17 1402             Exercise Goal Re-Evaluation   Exercise Goals Review Increase Physical Activity;Increase Strength and Stamina       Comments Tammi Klippel  continues to do well in rehab. She is up to 3 METs on the NuStep.  We will continue to monitor her progression.  She has missed her balance class for the last week as the instructor was out of town.       Expected Outcomes Short: Increase workloads on XR and NuStep.  Long: Make exercise part of daily habit.          Discharge Exercise Prescription (Final Exercise Prescription Changes):     Exercise Prescription Changes - 07/12/17 1400      Response to Exercise   Blood Pressure (Admit) 122/70   Blood Pressure (Exit) 124/72   Heart Rate (Admit) 95 bpm   Heart Rate (Exercise) 117 bpm   Heart Rate (Exit) 94 bpm   Oxygen Saturation (Admit) 97 %   Oxygen Saturation (Exercise) 94 %   Oxygen Saturation (Exit) 94 %   Rating of Perceived Exertion (Exercise) 12   Perceived Dyspnea (Exercise) 2   Symptoms none   Duration Continue with 45 min of aerobic exercise without signs/symptoms of physical distress.   Intensity THRR unchanged     Progression   Progression Continue to progress workloads to maintain intensity without signs/symptoms of physical distress.   Average METs 3.37     Resistance Training   Training Prescription Yes   Weight 4 lbs  green band   Reps 10-15     Interval Training   Interval Training No     Treadmill   MPH 1.7   Grade 0   Minutes 15   METs 2.3     NuStep   Level 3   SPM 68   Minutes 15   METs 3     REL-XR   Level 3   Speed 55   Minutes 15   METs 3.9     Home Exercise Plan   Plans to continue exercise at Home (comment)  walking, stationary bike, arthritis and balance class   Frequency Add 2 additional days to program exercise sessions.   Initial Home Exercises Provided 06/10/17      Nutrition:  Target Goals: Understanding of nutrition guidelines, daily intake of sodium <1537m, cholesterol <2071m calories 30% from fat and 7% or less from saturated fats, daily to have 5 or more servings of fruits and vegetables.  Biometrics:     Pre  Biometrics - 05/24/17 1407      Pre Biometrics   Height 5' 4.8" (1.646 m)   Weight 217 lb 1.6 oz (98.5 kg)   Waist Circumference 40 inches   Hip Circumference 50.5 inches   Waist  to Hip Ratio 0.79 %   BMI (Calculated) 36.4       Nutrition Therapy Plan and Nutrition Goals:   Nutrition Discharge: Rate Your Plate Scores:   Nutrition Goals Re-Evaluation:     Nutrition Goals Re-Evaluation    Row Name 06/20/17 1304 07/08/17 1233           Goals   Current Weight 218 lb (98.9 kg)  -      Comment Patient does not want to see dietician at this time, but reports that she does read food labels and tries to watch protion sizes. She aims to eat 6 small meals a day.  Tammi Klippel does not wish to meet with the dietician. She reports knowing what to eat, just struggles with preparing food for one. She is reading labels and trying new recipes.          Nutrition Goals Discharge (Final Nutrition Goals Re-Evaluation):     Nutrition Goals Re-Evaluation - 07/08/17 1233      Goals   Comment Bobbi does not wish to meet with the dietician. She reports knowing what to eat, just struggles with preparing food for one. She is reading labels and trying new recipes.       Psychosocial: Target Goals: Acknowledge presence or absence of significant depression and/or stress, maximize coping skills, provide positive support system. Participant is able to verbalize types and ability to use techniques and skills needed for reducing stress and depression.   Initial Review & Psychosocial Screening:     Initial Psych Review & Screening - 05/24/17 1334      Initial Review   Current issues with None Identified     Family Dynamics   Good Support System? Yes     Barriers   Psychosocial barriers to participate in program There are no identifiable barriers or psychosocial needs.     Screening Interventions   Interventions Encouraged to exercise      Quality of Life Scores:     Quality of Life -  05/24/17 1337      Quality of Life Scores   Health/Function Pre 20.79 %   Socioeconomic Pre 23.33 %   Psych/Spiritual Pre 23.57 %   Family Pre 27.5 %   GLOBAL Pre 22.62 %      PHQ-9: Recent Review Flowsheet Data    Depression screen [facility] 2/9 05/24/2017   Decreased Interest 0   Down, Depressed, Hopeless 0   PHQ - 2 Score 0   Altered sleeping 2   Tired, decreased energy 2   Change in appetite 1   Feeling bad or failure about yourself  0   Trouble concentrating 0   Moving slowly or fidgety/restless 0   Suicidal thoughts 0   PHQ-9 Score 5   Difficult doing work/chores Not difficult at all     Interpretation of Total Score  Total Score Depression Severity:  1-4 = Minimal depression, 5-9 = Mild depression, 10-14 = Moderate depression, 15-19 = Moderately severe depression, 20-27 = Severe depression   Psychosocial Evaluation and Intervention:     Psychosocial Evaluation - 06/08/17 1244      Psychosocial Evaluation & Interventions   Interventions Encouraged to exercise with the program and follow exercise prescription;Relaxation education;Stress management education   Comments Counselor met with Ms. Raenette Rover Tammi Klippel) today for initial psychosocial evaluation.  She is an 83 year old who struggles with COPD.   Bobbi lives alone but has a daughter and her family who live locally and are in  contact with her often.  Tammi Klippel has had open heart surgery in the past and a knee replacement 13 months ago that she reports "still hurts."  She reports sleeping "not great" which has been going on for quite some time with difficulty getting to sleep initially - but getting approximately 6 hours when she does.  She denies a history of depression or anxiety or any current symptoms and states she is typically in a positive mood with minimal stress in her life.  Tammi Klippel has goals to breathe better and lose some weight while in this program.  Staff will follow through the course of the program with Bobbi.    Expected Outcomes Bobbi will benefit from consistent exercise to achieve her stated goals.  Meeting with the dietician to address the weight loss goals will be helpful.  Staff will follow   Continue Psychosocial Services  Follow up required by staff      Psychosocial Re-Evaluation:     Psychosocial Re-Evaluation    [place] Name 06/20/17 1306 07/06/17 1244 07/08/17 1236         Psychosocial Re-Evaluation   Current issues with Current Sleep Concerns  - None Identified     Comments Patient reports she is still under little stress and has a positive attitude most of the time. Her only concern is that she has a hard time getting to sleep and only sleeps about 6 hours a night (no change since last assessment) Bobbi reports enjoying this class and her family and she are noticing positive benefits as she is experiencing less shortness of breath and able to do more.  She continues to sleep about 6 hours a night and reports this is enough currently and she manages her stress well with exercise and a positive attitude.  Counselor commended [employer] for her progress made in such a short time.   Tammi Klippel is very encouraged at the progress she is making. She thoroughly enjoys coming to class and everything she is learning. She enjoys walking her dog, spending time with family, and attending classes at the [facility].      Expected Outcomes Short: wants to try listening to soft music before bed to help her fall asleep. Long: establish better evening habits to help her fall asleep more consistantly  Continue to exercise and work on sleep strategies.   Short: continue to exercise at [place]; Long: continue the positive lifestyle changes she is learning and apply to life at home     Interventions  -  - Encouraged to attend Pulmonary Rehabilitation for the exercise     Continue Psychosocial Services  Follow up required by staff  -  -        Psychosocial Discharge (Final Psychosocial Re-Evaluation):     Psychosocial  Re-Evaluation - 07/08/17 1236      Psychosocial Re-Evaluation   Current issues with None Identified   Comments Tammi Klippel is very encouraged at the progress she is making. She thoroughly enjoys coming to class and everything she is learning. She enjoys walking her dog, spending time with family, and attending classes at the [facility].    Expected Outcomes Short: continue to exercise at [place]; Long: continue the positive lifestyle changes she is learning and apply to life at home   Interventions Encouraged to attend Pulmonary Rehabilitation for the exercise      Education: Education Goals: Education classes will be provided on a weekly basis, covering required topics. Participant will state understanding/return demonstration of topics presented.  Learning  Barriers/Preferences:     Learning Barriers/Preferences - 05/24/17 1328      Learning Barriers/Preferences   Learning Barriers None   Learning Preferences None      Education Topics: Initial Evaluation Education: - Verbal, written and demonstration of respiratory meds, RPE/PD scales, oximetry and breathing techniques. Instruction on use of nebulizers and MDIs: cleaning and proper use, rinsing mouth with steroid doses and importance of monitoring MDI activations.   Pulmonary Rehab from 07/22/2017 in [facility] Cardiac and Pulmonary Rehab  Date  05/24/17  Educator  [facility]  Instruction Review Code (retired)  2- meets goals/outcomes  Instruction Review Code  1- IT trainer Nutrition Guidelines/Fats and Fiber: -Group instruction provided by verbal, written material, models and posters to present the general guidelines for heart healthy nutrition. Gives an explanation and review of dietary fats and fiber.   Pulmonary Rehab from 07/22/2017 in [facility] Cardiac and Pulmonary Rehab  Date  06/20/17  Educator  CR  Instruction Review Code  1- Verbalizes Understanding      Controlling Sodium/Reading Food Labels: -Group  verbal and written material supporting the discussion of sodium use in heart healthy nutrition. Review and explanation with models, verbal and written materials for utilization of the food label.   Pulmonary Rehab from 07/22/2017 in [facility] Cardiac and Pulmonary Rehab  Date  06/27/17  Educator  CR  Instruction Review Code  1- Verbalizes Understanding      Exercise Physiology & Risk Factors: - Group verbal and written instruction with models to review the exercise physiology of the cardiovascular system and associated critical values. Details cardiovascular disease risk factors and the goals associated with each risk factor.   Aerobic Exercise & Resistance Training: - Gives group verbal and written discussion on the health impact of inactivity. On the components of aerobic and resistive training programs and the benefits of this training and how to safely progress through these programs.   Flexibility, Balance, General Exercise Guidelines: - Provides group verbal and written instruction on the benefits of flexibility and balance training programs. Provides general exercise guidelines with specific guidelines to those with heart or lung disease. Demonstration and skill practice provided.   Pulmonary Rehab from 07/22/2017 in [facility] Cardiac and Pulmonary Rehab  Date  06/15/17  Educator  AS  Instruction Review Code  1- Verbalizes Understanding      Stress Management: - Provides group verbal and written instruction about the health risks of elevated stress, cause of high stress, and healthy ways to reduce stress.   Pulmonary Rehab from 07/22/2017 in [facility] Cardiac and Pulmonary Rehab  Date  07/20/17  Educator  [facility]  Instruction Review Code  1- Verbalizes Understanding      Depression: - Provides group verbal and written instruction on the correlation between heart/lung disease and depressed mood, treatment options, and the stigmas associated with seeking treatment.   Pulmonary Rehab from 07/22/2017  in [facility] Cardiac and Pulmonary Rehab  Date  06/22/17  Educator  [facility]  Instruction Review Code (retired)  2- meets goals/outcomes  Instruction Review Code  1- Science writer Understanding      Exercise & Equipment Safety: - Individual verbal instruction and demonstration of equipment use and safety with use of the equipment.   Infection Prevention: - Provides verbal and written material to individual with discussion of infection control including proper hand washing and proper equipment cleaning during exercise session.   Pulmonary Rehab from 07/22/2017 in [facility] Cardiac and Pulmonary Rehab  Date  05/24/17  Educator  [facility]  Instruction Review Code  1- Verbalizes Understanding      Falls Prevention: - Provides verbal and written material to individual with discussion of falls prevention and safety.   Pulmonary Rehab from 07/22/2017 in [facility] Cardiac and Pulmonary Rehab  Date  05/24/17  Educator  [facility]  Instruction Review Code (retired)  2- meets goals/outcomes  Instruction Review Code  1- Science writer Understanding      Diabetes: - Individual verbal and written instruction to review signs/symptoms of diabetes, desired ranges of glucose level fasting, after meals and with exercise. Advice that pre and post exercise glucose checks will be done for 3 sessions at entry of program.   Chronic Lung Diseases: - Group verbal and written instruction to review new updates, new respiratory medications, new advancements in procedures and treatments. Provide informative websites and "800" numbers of self-education.   Lung Procedures: - Group verbal and written instruction to describe testing methods done to diagnose lung disease. Review the outcome of test results. Describe the treatment choices: Pulmonary Function Tests, ABGs and oximetry.   Energy Conservation: - Provide group verbal and written instruction for methods to conserve energy, plan and organize activities. Instruct on pacing techniques, use of  adaptive equipment and posture/positioning to relieve shortness of breath.   Triggers: - Group verbal and written instruction to review types of environmental controls: home humidity, furnaces, filters, dust mite/pet prevention, HEPA vacuums. To discuss weather changes, air quality and the benefits of nasal washing.   Exacerbations: - Group verbal and written instruction to provide: warning signs, infection symptoms, calling MD promptly, preventive modes, and value of vaccinations. Review: effective airway clearance, coughing and/or vibration techniques. Create an Sports administrator.   Pulmonary Rehab from 07/22/2017 in [facility] Cardiac and Pulmonary Rehab  Date  06/01/17  Educator  [facility]  Instruction Review Code  1- Verbalizes Understanding      Oxygen: - Individual and group verbal and written instruction on oxygen therapy. Includes supplement oxygen, available portable oxygen systems, continuous and intermittent flow rates, oxygen safety, concentrators, and Medicare reimbursement for oxygen.   Respiratory Medications: - Group verbal and written instruction to review medications for lung disease. Drug class, frequency, complications, importance of spacers, rinsing mouth after steroid MDI's, and proper cleaning methods for nebulizers.   Pulmonary Rehab from 07/22/2017 in [facility] Cardiac and Pulmonary Rehab  Date  05/24/17  Educator  [facility]  Instruction Review Code (retired)  2- meets goals/outcomes  Instruction Review Code  1- Science writer Understanding      AED/CPR: - Group verbal and written instruction with the use of models to demonstrate the basic use of the AED with the basic ABC's of resuscitation.   Pulmonary Rehab from 07/22/2017 in [facility] Cardiac and Pulmonary Rehab  Date  07/22/17  Educator  CE  Instruction Review Code  1- Verbalizes Understanding      Breathing Retraining: - Provides individuals verbal and written instruction on purpose, frequency, and proper technique of diaphragmatic  breathing and pursed-lipped breathing. Applies individual practice skills.   Pulmonary Rehab from 07/22/2017 in [facility] Cardiac and Pulmonary Rehab  Date  06/01/17  Educator  [facility]  Instruction Review Code  1- Verbalizes Understanding      Anatomy and Physiology of the Lungs: - Group verbal and written instruction with the use of models to provide basic lung anatomy and physiology related to function, structure and complications of lung disease.   Pulmonary Rehab from 07/22/2017 in [facility] Cardiac and Pulmonary Rehab  Date  07/06/17  Educator  [facility]  Instruction Review Code  1- Verbalizes Understanding      Anatomy & Physiology of the Heart: - Group verbal and written instruction and models provide basic cardiac anatomy and physiology, with the coronary electrical and arterial systems. Review of: AMI, Angina, Valve disease, Heart Failure, Cardiac Arrhythmia, Pacemakers, and the ICD.   Pulmonary Rehab from 07/22/2017 in [facility] Cardiac and Pulmonary Rehab  Date  06/10/17  Educator  [facility]  Instruction Review Code  1- Verbalizes Understanding      Heart Failure: - Group verbal and written instruction on the basics of heart failure: signs/symptoms, treatments, explanation of ejection fraction, enlarged heart and cardiomyopathy.   Pulmonary Rehab from 07/22/2017 in [facility] Cardiac and Pulmonary Rehab  Date  06/10/17  Educator  [facility]  Instruction Review Code  1- Verbalizes Understanding      Sleep Apnea: - Individual verbal and written instruction to review Obstructive Sleep Apnea. Review of risk factors, methods for diagnosing and types of masks and machines for OSA.   Anxiety: - Provides group, verbal and written instruction on the correlation between heart/lung disease and anxiety, treatment options, and management of anxiety.   Pulmonary Rehab from 07/22/2017 in [facility] Cardiac and Pulmonary Rehab  Date  07/20/17  Educator  [facility]  Instruction Review Code  1- Verbalizes Understanding      Relaxation: -  Provides group, verbal and written instruction about the benefits of relaxation for patients with heart/lung disease. Also provides patients with examples of relaxation techniques.   Cardiac Medications: - Group verbal and written instruction to review commonly prescribed medications for heart disease. Reviews the medication, class of the drug, and side effects.   Pulmonary Rehab from 07/22/2017 in [facility] Cardiac and Pulmonary Rehab  Date  07/08/17  Educator  CE  Instruction Review Code  1- Verbalizes Understanding      Know Your Numbers: -Group verbal and written instruction about important numbers in your health.  Review of Cholesterol, Blood Pressure, Diabetes, and BMI and the role they play in your overall health.   Other: -Provides group and verbal instruction on various topics (see comments)    Knowledge Questionnaire Score:     Knowledge Questionnaire Score - 05/24/17 1329      Knowledge Questionnaire Score   Pre Score 7/10       Core Components/Risk Factors/Patient Goals at Admission:     Personal Goals and Risk Factors at Admission - 05/24/17 1320      Core Components/Risk Factors/Patient Goals on Admission    Weight Management Yes;Obesity   Intervention Weight Management: Develop a combined nutrition and exercise program designed to reach desired caloric intake, while maintaining appropriate intake of nutrient and fiber, sodium and fats, and appropriate energy expenditure required for the weight goal.;Weight Management: Provide education and appropriate resources to help participant work on and attain dietary goals.;Weight Management/Obesity: Establish reasonable short term and long term weight goals.;Obesity: Provide education and appropriate resources to help participant work on and attain dietary goals.   Admit Weight 217 lb 1.6 oz (98.5 kg)   Goal Weight: Short Term 212 lb (96.2 kg)   Goal Weight: Long Term 180 lb (81.6 kg)   Expected Outcomes Short Term: Continue  to assess and modify interventions until short term weight is achieved;Long Term: Adherence to nutrition and physical activity/exercise program aimed toward attainment of established weight goal;Weight Maintenance: Understanding of the daily nutrition guidelines, which includes 25-35% calories from fat, 7% or less cal from saturated fats, less than  235m cholesterol, less than 1.5gm of sodium, & 5 or more servings of fruits and vegetables daily;Weight Loss: Understanding of general recommendations for a balanced deficit meal plan, which promotes 1-2 lb weight loss per week and includes a negative energy balance of [phone] kcal/d;Understanding recommendations for meals to include 15-35% energy as protein, 25-35% energy from fat, 35-60% energy from carbohydrates, less than 2049mof dietary cholesterol, 20-35 gm of total fiber daily;Understanding of distribution of calorie intake throughout the day with the consumption of 4-5 meals/snacks   Tobacco Cessation --  patient does not smoke    Improve shortness of breath with ADL's Yes   Intervention Provide education, individualized exercise plan and daily activity instruction to help decrease symptoms of SOB with activities of daily living.   Expected Outcomes Short Term: Achieves a reduction of symptoms when performing activities of daily living.   Develop more efficient breathing techniques such as purse lipped breathing and diaphragmatic breathing; and practicing self-pacing with activity Yes   Intervention Provide education, demonstration and support about specific breathing techniuqes utilized for more efficient breathing. Include techniques such as pursed lipped breathing, diaphragmatic breathing and self-pacing activity.   Expected Outcomes Short Term: Participant will be able to demonstrate and use breathing techniques as needed throughout daily activities.   Increase knowledge of respiratory medications and ability to use respiratory devices properly   Yes   Intervention Provide education and demonstration as needed of appropriate use of medications, inhalers, and oxygen therapy.   Expected Outcomes Short Term: Achieves understanding of medications use. Understands that oxygen is a medication prescribed by physician. Demonstrates appropriate use of inhaler and oxygen therapy.   Diabetes Yes  Pre diabetic   Intervention Provide education about signs/symptoms and action to take for hypo/hyperglycemia.;Provide education about proper nutrition, including hydration, and aerobic/resistive exercise prescription along with prescribed medications to achieve blood glucose in normal ranges: Fasting glucose 65-99 mg/dL   Expected Outcomes Short Term: Participant verbalizes understanding of the signs/symptoms and immediate care of hyper/hypoglycemia, proper foot care and importance of medication, aerobic/resistive exercise and nutrition plan for blood glucose control.;Long Term: Attainment of HbA1C < 7%.   Heart Failure Yes   Intervention Provide a combined exercise and nutrition program that is supplemented with education, support and counseling about heart failure. Directed toward relieving symptoms such as shortness of breath, decreased exercise tolerance, and extremity edema.   Expected Outcomes Improve functional capacity of life;Short term: Attendance in program 2-3 days a week with increased exercise capacity. Reported lower sodium intake. Reported increased fruit and vegetable intake. Reports medication compliance.;Short term: Daily weights obtained and reported for increase. Utilizing diuretic protocols set by physician.;Long term: Adoption of self-care skills and reduction of barriers for early signs and symptoms recognition and intervention leading to self-care maintenance.   Lipids Yes   Intervention Provide education and support for participant on nutrition & aerobic/resistive exercise along with prescribed medications to achieve LDL <7041mHDL >20m45m  Expected Outcomes Short Term: Participant states understanding of desired cholesterol values and is compliant with medications prescribed. Participant is following exercise prescription and nutrition guidelines.;Long Term: Cholesterol controlled with medications as prescribed, with individualized exercise RX and with personalized nutrition plan. Value goals: LDL < 70mg27mL > 40 mg.      Core Components/Risk Factors/Patient Goals Review:      Goals and Risk Factor Review    Row Name 06/20/17 1300 07/08/17 1222           Core Components/Risk Factors/Patient Goals  Review   Personal Goals Review Weight Management/Obesity;Diabetes;Hypertension;Lipids Weight Management/Obesity;Lipids;Hypertension;Improve shortness of breath with ADL's      Review Patient wants to continue to lose weight and is considered pre-diabetic. She recently had lab work done and the doctor told her the results were good.  Tammi Klippel reports she has lost a couple pounds but wishes to keep losing weight. She revealed struggles to cook for one at home but is looking into different nutrition options. She did not want to meet with the nutritionist just yet, but knows that it is an option. She has noticed her decrease in blood pressure and her doctor is happy about her lab values so far. Her ability to do things at home and out has increased since starting class and she really is excited to see what else she can start doing with her improvement.       Expected Outcomes Short: 1-2 lbs weight loss per week. Long: continue exercise programs and healthy dietary choices to control BP, blood sugars, and lipid panels.  Short: lose 1-3 pounds in the next couple weeks by eating healthier, continue improvement with SOB by attending [facility] and exercising at home on her bike and walking her dog ; Long: lose 30 pounds, continue healthier lifestyle choices after [facility]         Core Components/Risk Factors/Patient Goals at Discharge (Final  Review):      Goals and Risk Factor Review - 07/08/17 1222      Core Components/Risk Factors/Patient Goals Review   Personal Goals Review Weight Management/Obesity;Lipids;Hypertension;Improve shortness of breath with ADL's   Review Bobbi reports she has lost a couple pounds but wishes to keep losing weight. She revealed struggles to cook for one at home but is looking into different nutrition options. She did not want to meet with the nutritionist just yet, but knows that it is an option. She has noticed her decrease in blood pressure and her doctor is happy about her lab values so far. Her ability to do things at home and out has increased since starting class and she really is excited to see what else she can start doing with her improvement.    Expected Outcomes Short: lose 1-3 pounds in the next couple weeks by eating healthier, continue improvement with SOB by attending [facility] and exercising at home on her bike and walking her dog ; Long: lose 30 pounds, continue healthier lifestyle choices after [facility]      ITP Comments:     ITP Comments    Row Name 05/24/17 1315 05/30/17 0836 06/27/17 0843 07/25/17 0841     ITP Comments Medical evaluation completed. Visit diagnosis can be found in [facility] encounter 05/24/17, Initial ITP sent to Dr. Emily Filbert director of [place] for review and changes. 30 day review completed ITP sent to Dr. Ramonita Lab for Dr. Emily Filbert Director of [place]. Continue with ITP unless changes are made by physician. New to program has not started sessions. 30 day review completed. ITP sent to Dr. Emily Filbert Director of [place]. Continue with ITP unless changes are made by physician.   30 day review completed. ITP sent to Dr. Emily Filbert Director of [place]. Continue with ITP unless changes are made by physician.         Comments: 30 day review

## 2017-07-27 DIAGNOSIS — J449 Chronic obstructive pulmonary disease, unspecified: Secondary | ICD-10-CM | POA: Diagnosis not present

## 2017-07-27 NOTE — Progress Notes (Signed)
Daily Session Note  Patient Details  Name: Claire Cortez MRN: [id number] Date of Birth: [birth year] Referring Provider:     Pulmonary Rehab from 05/24/2017 in [facility] Cardiac and Pulmonary Rehab  Referring Provider  Ancil Linsey MD      Encounter Date: 07/27/2017  Check In:     Session Check In - 07/27/17 1140      Check-In   Location [facility]-Cardiac & Pulmonary Rehab   Staff Present Alberteen Sam, MA, ACSM RCEP, Exercise Physiologist;Joseph Alcus Dad, RN BSN   Supervising physician immediately available to respond to emergencies [facility] immediately available ER MD   Physician(s) Dr. Joni Fears and Burlene Arnt   Medication changes reported     No   Fall or balance concerns reported    No   Warm-up and Cool-down Performed as group-led instruction   Resistance Training Performed Yes   VAD Patient? No     Pain Assessment   Currently in Pain? No/denies   Multiple Pain Sites No           Exercise Prescription Changes - 07/26/17 1400      Response to Exercise   Blood Pressure (Admit) 130/78   Blood Pressure (Exit) 106/54   Heart Rate (Admit) 104 bpm   Heart Rate (Exercise) 123 bpm   Heart Rate (Exit) 92 bpm   Oxygen Saturation (Admit) 97 %   Oxygen Saturation (Exercise) 95 %   Oxygen Saturation (Exit) 93 %   Rating of Perceived Exertion (Exercise) 12   Perceived Dyspnea (Exercise) 1   Symptoms none   Duration Continue with 45 min of aerobic exercise without signs/symptoms of physical distress.   Intensity THRR unchanged     Progression   Progression Continue to progress workloads to maintain intensity without signs/symptoms of physical distress.   Average METs 3.13     Resistance Training   Training Prescription Yes   Weight 4 lbs   Reps 10-15     Interval Training   Interval Training No     Treadmill   MPH 1.7   Grade 0   Minutes 15   METs 2.3     NuStep   Level 4   SPM 99   Minutes 15   METs 3     REL-XR   Level 4   Speed 53   Minutes 15   METs 4.1     Home Exercise Plan   Plans to continue exercise at Home (comment)  walking, stationary bike, arthritis and balance class   Frequency Add 2 additional days to program exercise sessions.   Initial Home Exercises Provided 06/10/17      History  Smoking Status  . Former Smoker  . Packs/day: 1.50  . Years: 15.00  . Types: Cigarettes  . Quit date: 08/25/1974  Smokeless Tobacco  . Never Used    Comment: patient has not smoked since 1975    Goals Met:  Independence with exercise equipment Exercise tolerated well No report of cardiac concerns or symptoms Strength training completed today  Goals Unmet:  Not Applicable  Comments: Pt able to follow exercise prescription today without complaint.  Will continue to monitor for progression.   Dr. Emily Filbert is Medical Director for [place] and [facility].

## 2017-07-29 ENCOUNTER — Encounter: Payer: Medicare Other | Admitting: *Deleted

## 2017-07-29 DIAGNOSIS — J449 Chronic obstructive pulmonary disease, unspecified: Secondary | ICD-10-CM

## 2017-07-29 NOTE — Progress Notes (Signed)
Daily Session Note  Patient Details  Name: Claire Cortez MRN: [id number] Date of Birth: 1934-04-29 Referring Provider:     Pulmonary Rehab from 05/24/2017 in [facility] Cardiac and Pulmonary Rehab  Referring Provider  Ancil Linsey MD      Encounter Date: 07/29/2017  Check In:     Session Check In - 07/29/17 1154      Check-In   Location [facility]-Cardiac & Pulmonary Rehab   Staff Present Renita Papa, RN BSN;Joseph Christain [place], RN BSN   Supervising physician immediately available to respond to emergencies [facility] immediately available ER MD   Physician(s) Dr. Reita Cliche and R[place]ark   Medication changes reported     No   Fall or balance concerns reported    No   Tobacco Cessation No Change   Warm-up and Cool-down Performed as group-led instruction   Resistance Training Performed Yes   VAD Patient? No     Pain Assessment   Currently in Pain? No/denies   Multiple Pain Sites No         History  Smoking Status  . Former Smoker  . Packs/day: 1.50  . Years: 15.00  . Types: Cigarettes  . Quit date: 08/25/1974  Smokeless Tobacco  . Never Used    Comment: patient has not smoked since 1975    Goals Met:  Proper associated with RPD/PD & O2 Sat Independence with exercise equipment Using PLB without cueing & demonstrates good technique Exercise tolerated well Strength training completed today  Goals Unmet:  Not Applicable  Comments: Pt able to follow exercise prescription today without complaint.  Will continue to monitor for progression.    Dr. Emily Filbert is Medical Director for [place] and [facility].

## 2017-08-01 ENCOUNTER — Encounter: Payer: Medicare Other | Attending: Specialist

## 2017-08-01 DIAGNOSIS — Z7982 Long term (current) use of aspirin: Secondary | ICD-10-CM | POA: Diagnosis not present

## 2017-08-01 DIAGNOSIS — Z87891 Personal history of nicotine dependence: Secondary | ICD-10-CM | POA: Diagnosis not present

## 2017-08-01 DIAGNOSIS — E039 Hypothyroidism, unspecified: Secondary | ICD-10-CM | POA: Diagnosis not present

## 2017-08-01 DIAGNOSIS — E785 Hyperlipidemia, unspecified: Secondary | ICD-10-CM | POA: Diagnosis not present

## 2017-08-01 DIAGNOSIS — J449 Chronic obstructive pulmonary disease, unspecified: Secondary | ICD-10-CM

## 2017-08-01 DIAGNOSIS — R7309 Other abnormal glucose: Secondary | ICD-10-CM | POA: Insufficient documentation

## 2017-08-01 DIAGNOSIS — Z79899 Other long term (current) drug therapy: Secondary | ICD-10-CM | POA: Insufficient documentation

## 2017-08-01 DIAGNOSIS — I2510 Atherosclerotic heart disease of native coronary artery without angina pectoris: Secondary | ICD-10-CM | POA: Insufficient documentation

## 2017-08-01 NOTE — Progress Notes (Signed)
Daily Session Note  Patient Details  Name: Claire Cortez MRN: [id number] Date of Birth: Jul 08, 1934 Referring Provider:     Pulmonary Rehab from 05/24/2017 in [facility] Cardiac and Pulmonary Rehab  Referring Provider  Ancil Linsey MD      Encounter Date: 08/01/2017  Check In:     Session Check In - 08/01/17 1236      Check-In   Location [facility]-Cardiac & Pulmonary Rehab   Staff Present Nada Maclachlan, BA, ACSM CEP, Exercise Physiologist;Kelly Amedeo Plenty, BS, ACSM CEP, Exercise Physiologist;Ianmichael Amescua Flavia Shipper   Supervising physician immediately available to respond to emergencies [facility] immediately available ER MD   Physician(s) Dr. Gennaro Africa and Mariea Clonts   Medication changes reported     No   Fall or balance concerns reported    No   Warm-up and Cool-down Performed as group-led instruction   Resistance Training Performed Yes   VAD Patient? No     Pain Assessment   Currently in Pain? No/denies   Multiple Pain Sites No         History  Smoking Status  . Former Smoker  . Packs/day: 1.50  . Years: 15.00  . Types: Cigarettes  . Quit date: 08/25/1974  Smokeless Tobacco  . Never Used    Comment: patient has not smoked since 1975    Goals Met:  Independence with exercise equipment Exercise tolerated well No report of cardiac concerns or symptoms Strength training completed today  Goals Unmet:  Not Applicable  Comments: Pt able to follow exercise prescription today without complaint.  Will continue to monitor for progression.   Dr. Emily Filbert is Medical Director for [place] and [facility].

## 2017-08-03 ENCOUNTER — Encounter: Payer: Medicare Other | Admitting: *Deleted

## 2017-08-03 DIAGNOSIS — J449 Chronic obstructive pulmonary disease, unspecified: Secondary | ICD-10-CM | POA: Diagnosis not present

## 2017-08-03 NOTE — Progress Notes (Signed)
Daily Session Note  Patient Details  Name: Claire Cortez MRN: [id number] Date of Birth: Dec 04, 1933 Referring Provider:     Pulmonary Rehab from 05/24/2017 in [facility] Cardiac and Pulmonary Rehab  Referring Provider  Ancil Linsey MD      Encounter Date: 08/03/2017  Check In:     Session Check In - 08/03/17 1152      Check-In   Location [facility]Cardiac & Pulmonary Rehab   Staff Present Renita Papa, RN BSN;Joseph Darrin Nipper, [place], ACSM RCEP, Exercise Physiologist   Supervising physician immediately available to respond to emergencies [facility] immediately available ER MD   Physician(s) Dr. Clearnce Hasten and Jimmye Norman   Medication changes reported     No   Fall or balance concerns reported    No   Warm-up and Cool-down Performed as group-led instruction   Resistance Training Performed Yes   VAD Patient? No     Pain Assessment   Currently in Pain? No/denies         History  Smoking Status  . Former Smoker  . Packs/day: 1.50  . Years: 15.00  . Types: Cigarettes  . Quit date: 08/25/1974  Smokeless Tobacco  . Never Used    Comment: patient has not smoked since 1975    Goals Met:  Proper associated with RPD/PD & O2 Sat Independence with exercise equipment Using PLB without cueing & demonstrates good technique Exercise tolerated well Personal goals reviewed Strength training completed today  Goals Unmet:  Not Applicable  Comments: Pt able to follow exercise prescription today without complaint.  Will continue to monitor for progression. See ITP for goals review  Dr. Emily Filbert is Medical Director for [place] and [facility].

## 2017-08-05 ENCOUNTER — Encounter: Payer: Medicare Other | Admitting: *Deleted

## 2017-08-05 DIAGNOSIS — J449 Chronic obstructive pulmonary disease, unspecified: Secondary | ICD-10-CM | POA: Diagnosis not present

## 2017-08-05 NOTE — Progress Notes (Signed)
Daily Session Note  Patient Details  Name: Claire Cortez MRN: [id number] Date of Birth: 1934/04/11 Referring Provider:     Pulmonary Rehab from 05/24/2017 in [facility] Cardiac and Pulmonary Rehab  Referring Provider  Ancil Linsey MD      Encounter Date: 08/05/2017  Check In:     Session Check In - 08/05/17 1128      Check-In   Location [facility]Cardiac & Pulmonary Rehab   Staff Present Renita Papa, RN BSN;Joseph Darrin Nipper, [place], ACSM RCEP, Exercise Physiologist   Supervising physician immediately available to respond to emergencies [facility] immediately available ER MD   Physician(s) Dr. Corky Downs and Reita Cliche   Medication changes reported     No   Fall or balance concerns reported    No   Warm-up and Cool-down Performed as group-led instruction   Resistance Training Performed Yes   VAD Patient? No     Pain Assessment   Currently in Pain? No/denies         History  Smoking Status  . Former Smoker  . Packs/day: 1.50  . Years: 15.00  . Types: Cigarettes  . Quit date: 08/25/1974  Smokeless Tobacco  . Never Used    Comment: patient has not smoked since 1975    Goals Met:  Proper associated with RPD/PD & O2 Sat Independence with exercise equipment Using PLB without cueing & demonstrates good technique Exercise tolerated well Strength training completed today  Goals Unmet:  Not Applicable  Comments: Pt able to follow exercise prescription today without complaint.  Will continue to monitor for progression.    Dr. Emily Filbert is Medical Director for [name] and [facility].

## 2017-08-08 DIAGNOSIS — J449 Chronic obstructive pulmonary disease, unspecified: Secondary | ICD-10-CM

## 2017-08-08 NOTE — Progress Notes (Signed)
Daily Session Note  Patient Details  Name: Kayson Bullis MRN: [id number] Date of Birth: [birth year] Referring Provider:     Pulmonary Rehab from 05/24/2017 in [facility] Cardiac and Pulmonary Rehab  Referring Provider  Ancil Linsey MD      Encounter Date: 08/08/2017  Check In:     Session Check In - 08/08/17 1159      Check-In   Location [facility]-Cardiac & Pulmonary Rehab   Staff Present Nada Maclachlan, BA, ACSM CEP, Exercise Physiologist;Kelly Amedeo Plenty, BS, ACSM CEP, Exercise Physiologist;Sayeed Weatherall Flavia Shipper   Supervising physician immediately available to respond to emergencies [facility] immediately available ER MD   Physician(s) Dr. Jimmye Norman and [facility]   Medication changes reported     No   Fall or balance concerns reported    No   Warm-up and Cool-down Performed as group-led instruction   Resistance Training Performed Yes   VAD Patient? No     Pain Assessment   Currently in Pain? No/denies   Multiple Pain Sites No         History  Smoking Status  . Former Smoker  . Packs/day: 1.50  . Years: 15.00  . Types: Cigarettes  . Quit date: 08/25/1974  Smokeless Tobacco  . Never Used    Comment: patient has not smoked since 1975    Goals Met:  Independence with exercise equipment Exercise tolerated well No report of cardiac concerns or symptoms Strength training completed today  Goals Unmet:  Not Applicable  Comments: Pt able to follow exercise prescription today without complaint.  Will continue to monitor for progression.   Dr. Emily Filbert is Medical Director for [place] and [facility].

## 2017-08-10 DIAGNOSIS — J449 Chronic obstructive pulmonary disease, unspecified: Secondary | ICD-10-CM

## 2017-08-10 NOTE — Progress Notes (Signed)
Daily Session Note  Patient Details  Name: Claire Cortez MRN: [id number] Date of Birth: [birth year] Referring Provider:     Pulmonary Rehab from 05/24/2017 in [facility] Cardiac and Pulmonary Rehab  Referring Provider  Ancil Linsey MD      Encounter Date: 08/10/2017  Check In:     Session Check In - 08/10/17 1124      Check-In   Location [facility]Cardiac & Pulmonary Rehab   Staff Present Nada Maclachlan, BA, ACSM CEP, Exercise Physiologist;Taziah Difatta Alcus Dad, RN BSN   Supervising physician immediately available to respond to emergencies [facility] immediately available ER MD   Physician(s) Dr. Cinda Quest and Jimmye Norman   Medication changes reported     No   Fall or balance concerns reported    No   Warm-up and Cool-down Performed as group-led instruction   Resistance Training Performed Yes   VAD Patient? No     Pain Assessment   Currently in Pain? No/denies   Multiple Pain Sites No           Exercise Prescription Changes - 08/09/17 1500      Response to Exercise   Blood Pressure (Admit) 102/67   Blood Pressure (Exit) 126/70   Heart Rate (Admit) 100 bpm   Heart Rate (Exercise) 118 bpm   Heart Rate (Exit) 106 bpm   Oxygen Saturation (Admit) 96 %   Oxygen Saturation (Exercise) 94 %   Oxygen Saturation (Exit) 94 %   Rating of Perceived Exertion (Exercise) 12   Perceived Dyspnea (Exercise) 1   Symptoms none   Duration Continue with 45 min of aerobic exercise without signs/symptoms of physical distress.   Intensity THRR unchanged     Progression   Progression Continue to progress workloads to maintain intensity without signs/symptoms of physical distress.   Average METs 3.66     Resistance Training   Training Prescription Yes   Weight 4 lbs   Reps 10-15     Interval Training   Interval Training No     Treadmill   MPH 2   Grade 0.5   Minutes 15   METs 2.67     NuStep   Level 5   Minutes 15   METs 3.3     REL-XR   Level 7   Minutes 15    METs 5     Home Exercise Plan   Plans to continue exercise at Home (comment)  walking, stationary bike, arthritis and balance class   Frequency Add 2 additional days to program exercise sessions.   Initial Home Exercises Provided 06/10/17      History  Smoking Status  . Former Smoker  . Packs/day: 1.50  . Years: 15.00  . Types: Cigarettes  . Quit date: 08/25/1974  Smokeless Tobacco  . Never Used    Comment: patient has not smoked since 1975    Goals Met:  Independence with exercise equipment Exercise tolerated well No report of cardiac concerns or symptoms Strength training completed today  Goals Unmet:  Not Applicable  Comments: Pt able to follow exercise prescription today without complaint.  Will continue to monitor for progression.   Dr. Emily Filbert is Medical Director for [place] and [facility].

## 2017-08-12 DIAGNOSIS — J449 Chronic obstructive pulmonary disease, unspecified: Secondary | ICD-10-CM

## 2017-08-12 NOTE — Progress Notes (Signed)
Daily Session Note  Patient Details  Name: Claire Cortez MRN: [id number] Date of Birth: 09-Mar-1934 Referring Provider:     Pulmonary Rehab from 05/24/2017 in [facility] Cardiac and Pulmonary Rehab  Referring Provider  Ancil Linsey MD      Encounter Date: 08/12/2017  Check In:     Session Check In - 08/12/17 1116      Check-In   Location [facility]-Cardiac & Pulmonary Rehab   Staff Present Alberteen Sam, MA, ACSM RCEP, Exercise Physiologist;Ashaki Frosch Alcus Dad, RN BSN   Supervising physician immediately available to respond to emergencies [facility] immediately available ER MD   Physician(s) Dr. Clearnce Hasten and Cinda Quest   Medication changes reported     No   Fall or balance concerns reported    No   Warm-up and Cool-down Performed as group-led instruction   Resistance Training Performed Yes   VAD Patient? No     Pain Assessment   Currently in Pain? No/denies   Multiple Pain Sites No         History  Smoking Status  . Former Smoker  . Packs/day: 1.50  . Years: 15.00  . Types: Cigarettes  . Quit date: 08/25/1974  Smokeless Tobacco  . Never Used    Comment: patient has not smoked since 1975    Goals Met:  Independence with exercise equipment Exercise tolerated well No report of cardiac concerns or symptoms Strength training completed today  Goals Unmet:  Not Applicable  Comments: Pt able to follow exercise prescription today without complaint.  Will continue to monitor for progression.   Dr. Emily Filbert is Medical Director for [place] and [facility].

## 2017-08-15 DIAGNOSIS — J449 Chronic obstructive pulmonary disease, unspecified: Secondary | ICD-10-CM | POA: Diagnosis not present

## 2017-08-15 NOTE — Progress Notes (Signed)
Daily Session Note  Patient Details  Name: Claire Cortez MRN: [id number] Date of Birth: 24-Feb-1934 Referring Provider:     Pulmonary Rehab from 05/24/2017 in [facility] Cardiac and Pulmonary Rehab  Referring Provider  Ancil Linsey MD      Encounter Date: 08/15/2017  Check In:     Session Check In - 08/15/17 1113      Check-In   Location [facility]-Cardiac & Pulmonary Rehab   Staff Present Earlean Shawl, BS, ACSM CEP, Exercise Physiologist;Laureen Owens Shark, BS, RRT, Respiratory Therapist;Raigen Jagielski [place] physician immediately available to respond to emergencies [facility] immediately available ER MD   Physician(s) Dr. Cherylann Banas and Corky Downs   Medication changes reported     No   Fall or balance concerns reported    No   Warm-up and Cool-down Performed as group-led instruction   Resistance Training Performed Yes   VAD Patient? No     Pain Assessment   Currently in Pain? No/denies   Multiple Pain Sites No         History  Smoking Status  . Former Smoker  . Packs/day: 1.50  . Years: 15.00  . Types: Cigarettes  . Quit date: 08/25/1974  Smokeless Tobacco  . Never Used    Comment: patient has not smoked since 1975    Goals Met:  Proper associated with RPD/PD & O2 Sat Independence with exercise equipment Exercise tolerated well No report of cardiac concerns or symptoms Strength training completed today  Goals Unmet:  Not Applicable  Comments: Pt able to follow exercise prescription today without complaint.  Will continue to monitor for progression.   Dr. Emily Filbert is Medical Director for [place] and [facility].

## 2017-08-17 DIAGNOSIS — J449 Chronic obstructive pulmonary disease, unspecified: Secondary | ICD-10-CM | POA: Diagnosis not present

## 2017-08-17 NOTE — Progress Notes (Signed)
Daily Session Note  Patient Details  Name: Claire Cortez MRN: [id number] Date of Birth: 08-02-1934 Referring Provider:     Pulmonary Rehab from 05/24/2017 in [facility] Cardiac and Pulmonary Rehab  Referring Provider  Ancil Linsey MD      Encounter Date: 08/17/2017  Check In:     Session Check In - 08/17/17 1149      Check-In   Location [facility]Cardiac & Pulmonary Rehab   Staff Present Gerlene Burdock, RN, Geralyn Corwin, RN BSN;Aric Jost Flavia Shipper   Supervising physician immediately available to respond to emergencies [facility] immediately available ER MD   Physician(s) Dr. Mariea Clonts and Joni Fears   Medication changes reported     No   Fall or balance concerns reported    No   Warm-up and Cool-down Performed as group-led instruction   Resistance Training Performed Yes   VAD Patient? No     Pain Assessment   Currently in Pain? No/denies   Multiple Pain Sites No         History  Smoking Status  . Former Smoker  . Packs/day: 1.50  . Years: 15.00  . Types: Cigarettes  . Quit date: 08/25/1974  Smokeless Tobacco  . Never Used    Comment: patient has not smoked since 1975    Goals Met:  Independence with exercise equipment Exercise tolerated well No report of cardiac concerns or symptoms Strength training completed today  Goals Unmet:  Not Applicable  Comments: Pt able to follow exercise prescription today without complaint.  Will continue to monitor for progression.   Dr. Emily Filbert is Medical Director for [place] and [facility].

## 2017-08-19 ENCOUNTER — Encounter: Payer: Medicare Other | Admitting: *Deleted

## 2017-08-19 VITALS — Ht 64.8 in | Wt 217.0 lb

## 2017-08-19 DIAGNOSIS — J449 Chronic obstructive pulmonary disease, unspecified: Secondary | ICD-10-CM | POA: Diagnosis not present

## 2017-08-19 NOTE — Progress Notes (Signed)
Daily Session Note  Patient Details  Name: Claire Cortez MRN: [id number] Date of Birth: 1934-04-21 Referring Provider:     Pulmonary Rehab from 05/24/2017 in [facility] Cardiac and Pulmonary Rehab  Referring Provider  Ancil Linsey MD      Encounter Date: 08/19/2017  Check In:     Session Check In - 08/19/17 1129      Check-In   Location [facility]Cardiac & Pulmonary Rehab   Staff Present Renita Papa, RN BSN;Joseph Christain [place], RN BSN   Supervising physician immediately available to respond to emergencies [facility] immediately available ER MD   Physician(s) Dr. Kerman Passey and Quentin Cornwall   Medication changes reported     No   Fall or balance concerns reported    No   Warm-up and Cool-down Performed as group-led instruction   Resistance Training Performed Yes   VAD Patient? No     Pain Assessment   Currently in Pain? No/denies         History  Smoking Status  . Former Smoker  . Packs/day: 1.50  . Years: 15.00  . Types: Cigarettes  . Quit date: 08/25/1974  Smokeless Tobacco  . Never Used    Comment: patient has not smoked since 1975    Goals Met:  Proper associated with RPD/PD & O2 Sat Independence with exercise equipment Using PLB without cueing & demonstrates good technique Exercise tolerated well Strength training completed today  Goals Unmet:  Not Applicable  Comments:      [place] Name 05/24/17 1343 08/19/17 1209       6 Minute Walk   Phase Initial Discharge    Distance 970 feet 1106 feet    Distance % Change  - 14 %    Distance Feet Change  - 136 ft    Walk Time 4.98 minutes 5.5 minutes    # of Rest Breaks 2 1    MPH 2.21 2.28    METS 7.92 2.76    RPE 13 12    Perceived Dyspnea  2 1    VO2 Peak 6.72 8.01    Symptoms Yes (comment) Yes (comment)    Comments leg fatigue, compensating for Knee, SOB hip/leg pain 4/10    Resting HR 93 bpm 102 bpm    Resting BP 134/56 106/58    Resting Oxygen Saturation   -  96 %    Exercise Oxygen Saturation  during 6 min walk  - 90 %    Max Ex. HR 140 bpm 141 bpm    Max Ex. BP 162/70 174/70    2 Minute Post BP 154/72  -      Interval HR   Baseline HR (retired) 93  -    1 Minute HR 117  -    2 Minute HR 130 125    3 Minute HR 140 133    4 Minute HR 138 141    5 Minute HR 134 139    6 Minute HR 130 141    2 Minute Post HR 102 106    Interval Heart Rate? Yes  -      Interval Oxygen   Interval Oxygen? Yes  -    Baseline Oxygen Saturation % 92 % 96 %    Resting Liters of Oxygen 0 L  Room Air  -    1 Minute Oxygen Saturation % 92 %  -    1 Minute Liters of Oxygen 0 L 0 L  2 Minute Oxygen Saturation % 91 % 93 %    2 Minute Liters of Oxygen 0 L 0 L    3 Minute Oxygen Saturation % 90 % 94 %    3 Minute Liters of Oxygen 0 L 0 L    4 Minute Oxygen Saturation % 90 % 94 %    4 Minute Liters of Oxygen 0 L 0 L    5 Minute Oxygen Saturation % 93 % 90 %    5 Minute Liters of Oxygen 0 L 0 L    6 Minute Oxygen Saturation % 92 % 93 %    6 Minute Liters of Oxygen 0 L 0 L    2 Minute Post Oxygen Saturation % 94 % 98 %    2 Minute Post Liters of Oxygen 0 L 0 L      Pt able to follow exercise prescription today without complaint.  Will continue to monitor for progression.    Dr. Emily Filbert is Medical Director for [place] and [facility].

## 2017-08-22 DIAGNOSIS — J449 Chronic obstructive pulmonary disease, unspecified: Secondary | ICD-10-CM

## 2017-08-22 NOTE — Progress Notes (Signed)
Pulmonary Individual Treatment Plan  Patient Details  Name: Claire Cortez MRN: [id number] Date of Birth: 1934-08-02 Referring Provider:     Pulmonary Rehab from 05/24/2017 in [facility]  Referring Provider  Ancil Linsey MD      Initial Encounter Date:    Pulmonary Rehab from 05/24/2017 in [facility] Cardiac and Pulmonary Rehab  Date  05/24/17  Referring Provider  Ancil Linsey MD      Visit Diagnosis: Chronic obstructive pulmonary disease, unspecified COPD type ([place])  Patient's Home Medications on Admission:  Current Outpatient Prescriptions:  .  amitriptyline (ELAVIL) 25 MG tablet, Take by mouth., Disp: , Rfl:  .  aspirin EC 81 MG tablet, Take by mouth., Disp: , Rfl:  .  CO ENZYME Q-10 PO, Take 200 mg by mouth., Disp: , Rfl:  .  conjugated estrogens (PREMARIN) vaginal cream, Apply blueberry sized amount to vaginal opening with finger tip M-W-F nights., Disp: 42.5 g, Rfl: 12 .  Glucosamine HCl (SM GLUCOSAMINE HCL) 1500 MG TABS, Take by mouth., Disp: , Rfl:  .  levothyroxine (SYNTHROID, LEVOTHROID) 88 MCG tablet, TAKE 1 TABLET DAILY ON AN EMPTY STOMACH WITH A GLASS OF WATER AT LEAST 30 TO 60 MINUTES BEFORE BREAKFAST, Disp: , Rfl:  .  lisinopril (PRINIVIL,ZESTRIL) 10 MG tablet, TAKE 1 TABLET DAILY, Disp: , Rfl:  .  Multiple Vitamin (MULTI-VITAMINS) TABS, Take by mouth., Disp: , Rfl:  .  Omega-3 Fatty Acids (FISH OIL) 1200 MG CAPS, Take by mouth., Disp: , Rfl:  .  rosuvastatin (CRESTOR) 10 MG tablet, TAKE 1 TABLET NIGHTLY, Disp: , Rfl:  .  solifenacin (VESICARE) 5 MG tablet, Take 1 tablet (5 mg total) by mouth daily., Disp: 30 tablet, Rfl: 3 .  traMADol (ULTRAM) 50 MG tablet, Take by mouth., Disp: , Rfl:   Past Medical History: Past Medical History:  Diagnosis Date  . Acquired hypothyroidism 08/25/2015  . Arteriosclerosis of coronary artery 08/25/2015   Overview:  Sp cabg x3 with graft stent 2007   . Arthritis of knee, degenerative 04/10/2015  . Benign  essential HTN 10/24/2014  . Borderline diabetes mellitus 08/25/2015  . Chronic cervical pain 08/25/2015  . Chronic diarrhea 09/24/2014  . Combined fat and carbohydrate induced hyperlipemia 10/24/2014    Tobacco Use: History  Smoking Status  . Former Smoker  . Packs/day: 1.50  . Years: 15.00  . Types: Cigarettes  . Quit date: 08/25/1974  Smokeless Tobacco  . Never Used    Comment: patient has not smoked since 1975    Labs: Recent Review Flowsheet Data    There is no flowsheet data to display.       Pulmonary Assessment Scores:     Pulmonary Assessment Scores    Row Name 05/24/17 1334 07/01/17 1254 08/17/17 1150     ADL UCSD   ADL Phase Entry Mid Exit   SOB Score total 32 41 18   Rest 1 1 0   Walk 2 2 0   Stairs '3 2 1   ' Bath 0 1 0   Dress 0 1 0   Shop '3 3 1     ' mMRC Score   mMRC Score 1  -  -   Row Name 08/19/17 1212         mMRC Score   mMRC Score 0        Pulmonary Function Assessment:     Pulmonary Function Assessment - 05/24/17 1330      Pulmonary Function Tests  FVC% 76 %   FEV1% 76 %   FEV1/FVC Ratio 72     Breath   Bilateral Breath Sounds Clear   Shortness of Breath Fear of Shortness of Breath      Exercise Target Goals:    Exercise Program Goal: Individual exercise prescription set with THRR, safety & activity barriers. Participant demonstrates ability to understand and report RPE using BORG scale, to self-measure pulse accurately, and to acknowledge the importance of the exercise prescription.  Exercise Prescription Goal: Starting with aerobic activity 30 plus minutes a day, 3 days per week for initial exercise prescription. Provide home exercise prescription and guidelines that participant acknowledges understanding prior to discharge.  Activity Barriers & Risk Stratification:     Activity Barriers & Cardiac Risk Stratification - 05/24/17 1341      Activity Barriers & Cardiac Risk Stratification   Activity Barriers Right  Knee Replacement;Deconditioning;Muscular Weakness;Shortness of Breath      6 Minute Walk:     6 Minute Walk    Row Name 05/24/17 1343 08/19/17 1209       6 Minute Walk   Phase Initial Discharge    Distance 970 feet 1106 feet    Distance % Change  - 14 %    Distance Feet Change  - 136 ft    Walk Time 4.98 minutes 5.5 minutes    # of Rest Breaks 2 1    MPH 2.21 2.28    METS 7.92 2.76    RPE 13 12    Perceived Dyspnea  2 1    VO2 Peak 6.72 8.01    Symptoms Yes (comment) Yes (comment)    Comments leg fatigue, compensating for Knee, SOB hip/leg pain 4/10    Resting HR 93 bpm 102 bpm    Resting BP 134/56 106/58    Resting Oxygen Saturation   - 96 %    Exercise Oxygen Saturation  during 6 min walk  - 90 %    Max Ex. HR 140 bpm 141 bpm    Max Ex. BP 162/70 174/70    2 Minute Post BP 154/72  -      Interval HR   Baseline HR (retired) 93  -    1 Minute HR 117  -    2 Minute HR 130 125    3 Minute HR 140 133    4 Minute HR 138 141    5 Minute HR 134 139    6 Minute HR 130 141    2 Minute Post HR 102 106    Interval Heart Rate? Yes  -      Interval Oxygen   Interval Oxygen? Yes  -    Baseline Oxygen Saturation % 92 % 96 %    Resting Liters of Oxygen 0 L  Room Air  -    1 Minute Oxygen Saturation % 92 %  -    1 Minute Liters of Oxygen 0 L 0 L    2 Minute Oxygen Saturation % 91 % 93 %    2 Minute Liters of Oxygen 0 L 0 L    3 Minute Oxygen Saturation % 90 % 94 %    3 Minute Liters of Oxygen 0 L 0 L    4 Minute Oxygen Saturation % 90 % 94 %    4 Minute Liters of Oxygen 0 L 0 L    5 Minute Oxygen Saturation % 93 % 90 %    5 Minute Liters  of Oxygen 0 L 0 L    6 Minute Oxygen Saturation % 92 % 93 %    6 Minute Liters of Oxygen 0 L 0 L    2 Minute Post Oxygen Saturation % 94 % 98 %    2 Minute Post Liters of Oxygen 0 L 0 L      Oxygen Initial Assessment:     Oxygen Initial Assessment - 05/24/17 1326      Home Oxygen   Home Oxygen Device Home Concentrator   Sleep  Oxygen Prescription Continuous   Liters per minute 2   Home Exercise Oxygen Prescription None   Home at Rest Exercise Oxygen Prescription None   Compliance with Home Oxygen Use Yes     Initial 6 min Walk   Oxygen Used None     Program Oxygen Prescription   Program Oxygen Prescription None     Intervention   Short Term Goals To learn and understand importance of monitoring SPO2 with pulse oximeter and demonstrate accurate use of the pulse oximeter.;To Learn and understand importance of maintaining oxygen saturations>88%;To learn and demonstrate proper purse lipped breathing techniques or other breathing techniques.;To learn and demonstrate proper use of respiratory medications   Long  Term Goals Maintenance of O2 saturations>88%;Compliance with respiratory medication;Demonstrates proper use of MDI's;Exhibits proper breathing techniques, such as purse lipped breathing or other method taught during program session;Verbalizes importance of monitoring SPO2 with pulse oximeter and return demonstration      Oxygen Re-Evaluation:     Oxygen Re-Evaluation    Row Name 06/01/17 1421 06/20/17 1311 07/08/17 1239 08/03/17 1244       Program Oxygen Prescription   Program Oxygen Prescription  - None None None      Home Oxygen   Home Oxygen Device  - Home Concentrator;E-Tanks  - Home Concentrator;E-Tanks    Sleep Oxygen Prescription  - Continuous Continuous Continuous    Liters per minute  - '2 2 2    ' Home Exercise Oxygen Prescription  - None None None    Home at Rest Exercise Oxygen Prescription  - None None None    Compliance with Home Oxygen Use  - Yes Yes Yes      Goals/Expected Outcomes   Short Term Goals To learn and demonstrate proper purse lipped breathing techniques or other breathing techniques. To learn and exhibit compliance with exercise, home and travel O2 prescription;To learn and understand importance of monitoring SPO2 with pulse oximeter and demonstrate accurate use of the  pulse oximeter.;To Learn and understand importance of maintaining oxygen saturations>88%;To learn and demonstrate proper purse lipped breathing techniques or other breathing techniques.;To learn and demonstrate proper use of respiratory medications To learn and understand importance of monitoring SPO2 with pulse oximeter and demonstrate accurate use of the pulse oximeter.;To learn and understand importance of maintaining oxygen saturations>88%;To learn and demonstrate proper use of respiratory medications;To learn and demonstrate proper pursed lip breathing techniques or other breathing techniques. To learn and understand importance of monitoring SPO2 with pulse oximeter and demonstrate accurate use of the pulse oximeter.;To learn and understand importance of maintaining oxygen saturations>88%;To learn and demonstrate proper use of respiratory medications;To learn and demonstrate proper pursed lip breathing techniques or other breathing techniques.;To learn and exhibit compliance with exercise, home and travel O2 prescription    Long  Term Goals Exhibits proper breathing techniques, such as purse lipped breathing or other method taught during program session Exhibits compliance with exercise, home and travel O2 prescription;Maintenance of O2 saturations>88%;Compliance  with respiratory medication;Verbalizes importance of monitoring SPO2 with pulse oximeter and return demonstration;Exhibits proper breathing techniques, such as purse lipped breathing or other method taught during program session;Demonstrates proper use of MDI's Verbalizes importance of monitoring SPO2 with pulse oximeter and return demonstration;Exhibits proper breathing techniques, such as pursed lip breathing or other method taught during program session;Compliance with respiratory medication;Maintenance of O2 saturations>88% Verbalizes importance of monitoring SPO2 with pulse oximeter and return demonstration;Exhibits proper breathing  techniques, such as pursed lip breathing or other method taught during program session;Compliance with respiratory medication;Maintenance of O2 saturations>88%;Exhibits compliance with exercise, home and travel O2 prescription    Comments Reviewed pursed lip breathing technique with Claire Cortez today.  Worked on how to use technique and reviewed how it is helpful in controlling breath and maintain oxygen saturations.   Reviewed PLB and when to use it to control SOB. She monitors Sats and home and maintains O2 above 90. She is complient with oxygen use at night. Dr. stated if she loses weight she may be able to come off oxygen at night.  She is compliant with home medications and home O2 at night. She uses her pursed lip breathing when she is SOB and has noticed how much it helps her.  Claire Cortez is compliants with her home oxygen at night.  She uses her PLB regularly and it has helped her be able to do more at home.  Her medications are working for her and only needs her emergency inhaler on occasion. She has a follow up appointmnet on 10/16 and they plan to set up another sleep study.     Goals/Expected Outcomes Short: Become more proficient at PLB.  Long: Become independent at using PLB. Short: continue to use PLB idependenly during daily activities. Long: weight loss that will possibly allow her to come off oxygen at night.  Short: continue practicing PLB, using inhalers as needed, and home O2; Long: utilize these skills after [facility] is completed Short: Meet with doctor on 10/16 to discuss oxygen use.  Long; Continue to use PLB and manange lung disease.       Oxygen Discharge (Final Oxygen Re-Evaluation):     Oxygen Re-Evaluation - 08/03/17 1244      Program Oxygen Prescription   Program Oxygen Prescription None     Home Oxygen   Home Oxygen Device Home Concentrator;E-Tanks   Sleep Oxygen Prescription Continuous   Liters per minute 2   Home Exercise Oxygen Prescription None   Home at Rest Exercise  Oxygen Prescription None   Compliance with Home Oxygen Use Yes     Goals/Expected Outcomes   Short Term Goals To learn and understand importance of monitoring SPO2 with pulse oximeter and demonstrate accurate use of the pulse oximeter.;To learn and understand importance of maintaining oxygen saturations>88%;To learn and demonstrate proper use of respiratory medications;To learn and demonstrate proper pursed lip breathing techniques or other breathing techniques.;To learn and exhibit compliance with exercise, home and travel O2 prescription   Long  Term Goals Verbalizes importance of monitoring SPO2 with pulse oximeter and return demonstration;Exhibits proper breathing techniques, such as pursed lip breathing or other method taught during program session;Compliance with respiratory medication;Maintenance of O2 saturations>88%;Exhibits compliance with exercise, home and travel O2 prescription   Comments Claire Cortez is compliants with her home oxygen at night.  She uses her PLB regularly and it has helped her be able to do more at home.  Her medications are working for her and only needs her emergency inhaler on occasion. She  has a follow up appointmnet on 10/16 and they plan to set up another sleep study.    Goals/Expected Outcomes Short: Meet with doctor on 10/16 to discuss oxygen use.  Long; Continue to use PLB and manange lung disease.      Initial Exercise Prescription:     Initial Exercise Prescription - 05/24/17 1400      Date of Initial Exercise RX and Referring Provider   Date 05/24/17   Referring Provider Ancil Linsey MD     Treadmill   MPH 1.2   Grade 0   Minutes 15   METs 1.92     NuStep   Level 1   SPM 80   Minutes 15   METs 2     REL-XR   Level 1   Speed 50   Minutes 15   METs 2     Prescription Details   Frequency (times per week) 3   Duration Progress to 45 minutes of aerobic exercise without signs/symptoms of physical distress     Intensity   THRR 40-80% of Max  Heartrate 111-130   Ratings of Perceived Exertion 11-13   Perceived Dyspnea 0-4     Progression   Progression Continue to progress workloads to maintain intensity without signs/symptoms of physical distress.     Resistance Training   Training Prescription Yes   Weight 3 lbs   Reps 10-15      Perform Capillary Blood Glucose checks as needed.  Exercise Prescription Changes:     Exercise Prescription Changes    Row Name 06/01/17 1400 06/10/17 1200 06/14/17 1500 06/29/17 1400 07/12/17 1400     Response to Exercise   Blood Pressure (Admit) 116/70  - 130/72 134/66 122/70   Blood Pressure (Exercise) 154/84  - 140/70  -  -   Blood Pressure (Exit) 120/64  - 132/82 122/74 124/72   Heart Rate (Admit) 102 bpm  - 96 bpm 115 bpm 95 bpm   Heart Rate (Exercise) 114 bpm  - 114 bpm 134 bpm 117 bpm   Heart Rate (Exit) 99 bpm  - 84 bpm 101 bpm 94 bpm   Oxygen Saturation (Admit) 93 %  - 95 % 97 % 97 %   Oxygen Saturation (Exercise) 93 %  - 93 % 93 % 94 %   Oxygen Saturation (Exit) 94 %  - 93 % 92 % 94 %   Rating of Perceived Exertion (Exercise) 12  - '12 13 12   ' Perceived Dyspnea (Exercise) 2  - '1 2 2   ' Symptoms none  - none none none   Comments first full day of exercise  -  -  -  -   Duration Progress to 45 minutes of aerobic exercise without signs/symptoms of physical distress  - Continue with 45 min of aerobic exercise without signs/symptoms of physical distress. Continue with 45 min of aerobic exercise without signs/symptoms of physical distress. Continue with 45 min of aerobic exercise without signs/symptoms of physical distress.   Intensity THRR unchanged  - THRR unchanged THRR unchanged THRR unchanged     Progression   Progression Continue to progress workloads to maintain intensity without signs/symptoms of physical distress.  - Continue to progress workloads to maintain intensity without signs/symptoms of physical distress. Continue to progress workloads to maintain intensity without  signs/symptoms of physical distress. Continue to progress workloads to maintain intensity without signs/symptoms of physical distress.   Average METs 3.15  - 2.83 3.1 3.37     Resistance Training  Training Prescription Yes  - Yes Yes Yes   Weight 3 lbs  - 3 lbs 4 lbs 4 lbs  green band   Reps 10-15  - 10-15 10-15 10-15     Interval Training   Interval Training No  - No No No     Treadmill   MPH  -  - 1.3 1.7 1.7   Grade  -  - 0 0 0   Minutes  -  - '15 15 15   ' METs  -  - 12 2.3 2.3     NuStep   Level 1  - '2 3 3   ' SPM 93  - 84 89 68   Minutes 15  - '15 15 15   ' METs 2.7  - 2.7 2.9 3     REL-XR   Level 1  - '1 3 3   ' Speed 53  - 51 58 55   Minutes 15  - '15 15 15   ' METs 3.6  - 3.8 4.1 3.9     Home Exercise Plan   Plans to continue exercise at  - Home (comment)  walking, stationary bike, arthritis and balance class Home (comment)  walking, stationary bike, arthritis and balance class Home (comment)  walking, stationary bike, arthritis and balance class Home (comment)  walking, stationary bike, arthritis and balance class   Frequency  - Add 1 additional day to program exercise sessions. Add 1 additional day to program exercise sessions. Add 2 additional days to program exercise sessions. Add 2 additional days to program exercise sessions.   Initial Home Exercises Provided  - 06/10/17 06/10/17 06/10/17 06/10/17   Row Name 07/26/17 1400 08/09/17 1500           Response to Exercise   Blood Pressure (Admit) 130/78 102/67      Blood Pressure (Exit) 106/54 126/70      Heart Rate (Admit) 104 bpm 100 bpm      Heart Rate (Exercise) 123 bpm 118 bpm      Heart Rate (Exit) 92 bpm 106 bpm      Oxygen Saturation (Admit) 97 % 96 %      Oxygen Saturation (Exercise) 95 % 94 %      Oxygen Saturation (Exit) 93 % 94 %      Rating of Perceived Exertion (Exercise) 12 12      Perceived Dyspnea (Exercise) 1 1      Symptoms none none      Duration Continue with 45 min of aerobic exercise without  signs/symptoms of physical distress. Continue with 45 min of aerobic exercise without signs/symptoms of physical distress.      Intensity THRR unchanged THRR unchanged        Progression   Progression Continue to progress workloads to maintain intensity without signs/symptoms of physical distress. Continue to progress workloads to maintain intensity without signs/symptoms of physical distress.      Average METs 3.13 3.66        Resistance Training   Training Prescription Yes Yes      Weight 4 lbs 4 lbs      Reps 10-15 10-15        Interval Training   Interval Training No No        Treadmill   MPH 1.7 2      Grade 0 0.5      Minutes 15 15      METs 2.3 2.67        NuStep  Level 4 5      SPM 99  -      Minutes 15 15      METs 3 3.3        REL-XR   Level 4 7      Speed 53  -      Minutes 15 15      METs 4.1 5        Home Exercise Plan   Plans to continue exercise at Home (comment)  walking, stationary bike, arthritis and balance class Home (comment)  walking, stationary bike, arthritis and balance class      Frequency Add 2 additional days to program exercise sessions. Add 2 additional days to program exercise sessions.      Initial Home Exercises Provided 06/10/17 06/10/17         Exercise Comments:     Exercise Comments    Row Name 06/01/17 1415 06/03/17 1227         Exercise Comments First full day of exercise!  Patient was oriented to gym and equipment including functions, settings, policies, and procedures.  Patient's individual exercise prescription and treatment plan were reviewed.  All starting workloads were established based on the results of the 6 minute walk test done at initial orientation visit.  The plan for exercise progression was also introduced and progression will be customized based on patient's performance and goals. Claire Cortez did 4 min intervals on the treadmill today.  She will work up to the full 15 min!         Exercise Goals and Review:      Exercise Goals    Row Name 05/24/17 1407             Exercise Goals   Increase Physical Activity Yes       Intervention Provide advice, education, support and counseling about physical activity/exercise needs.;Develop an individualized exercise prescription for aerobic and resistive training based on initial evaluation findings, risk stratification, comorbidities and participant's personal goals.       Expected Outcomes Achievement of increased cardiorespiratory fitness and enhanced flexibility, muscular endurance and strength shown through measurements of functional capacity and personal statement of participant.       Increase Strength and Stamina Yes       Intervention Provide advice, education, support and counseling about physical activity/exercise needs.;Develop an individualized exercise prescription for aerobic and resistive training based on initial evaluation findings, risk stratification, comorbidities and participant's personal goals.       Expected Outcomes Achievement of increased cardiorespiratory fitness and enhanced flexibility, muscular endurance and strength shown through measurements of functional capacity and personal statement of participant.          Exercise Goals Re-Evaluation :     Exercise Goals Re-Evaluation    Row Name 06/01/17 1415 06/10/17 1245 06/14/17 1458 06/20/17 1253 06/29/17 1430     Exercise Goal Re-Evaluation   Exercise Goals Review Increase Physical Activity;Increase Strenth and Stamina Increase Physical Activity;Increase Strenth and Stamina Increase Physical Activity;Increase Strenth and Stamina Increase Physical Activity;Increase Strenth and Stamina Increase Physical Activity;Increase Strength and Stamina   Comments Claire Cortez completed her first full day of exercise today.  She had also attended her arthristis and balance class this morning.  We talked about how it may initially be too much for her to handle and we will see how it goes doing both  classes together.  She also mentioned that she has a recumbent bike at home. We will continue to monitor  her progression. Reviewed home exercise with pt today.  Pt plans to walk and use stationary bike at home for exercise.  Claire Cortez also attends an arthritis and balance class on MWF.  Reviewed THR, pulse, RPE, sign and symptoms, and when to call 911 or MD.  Also discussed weather considerations and indoor options.  Pt voiced understanding. Claire Cortez is off to a great start in rehab.  She has worked her way up to 15 continuous on the treadmill!!  She is also up to 3.8 METs on the XR.  We will continue to monitor her progression.  Claire Cortez has increased her level on the NS from 2 to 3 and her speed on the TM from 1.2-1.7 mph since starting program. She stated that she feels stronger and is able to do most of what she wants to. Claire Cortez also exercises independently at the senior center. Claire Cortez is doing well in rehab.  She is now on level 3 on the XR and maintaining her 1.7 mph for the full 15 min on the treadmill.  She is enjoying class and still able to go do her balance and arthritis class at the [facility].    Expected Outcomes Short: Come to [facility] routinely.  Long: Build up strength and stamina.  Short: Add in an extra day of home exercise in two weeks.  Long: Continue to increase physical activity.  Short: Move up on XR and add incline to treadmill.  Long: Exercise independently some at home. Short: Make increases on the XR and add elevation to TM. (added this increase for next exercise session Long: Consistantly attend pulmonary rehab and exercise at senior center. Aim for 4-5 days a week of exercise.  Short: Continue to work on increasing TM workloads.  Long: Continue to exercise daily.    [place] Name 07/12/17 1402 07/26/17 1439 08/03/17 1229 08/09/17 1454       Exercise Goal Re-Evaluation   Exercise Goals Review Increase Physical Activity;Increase Strength and Stamina Increase Physical Activity;Increase Strength  and Stamina Increase Physical Activity;Increase Strength and Stamina;Understanding of Exercise Prescription Increase Physical Activity;Increase Strength and Stamina    Comments Claire Cortez continues to do well in rehab. She is up to 3 METs on the NuStep.  We will continue to monitor her progression.  She has missed her balance class for the last week as the instructor was out of town. Claire Cortez has been doing well in rehab.   She is now on level 4 on the NuStep and XR.  We will continue to monitor her progress.  Claire Cortez continues to do well in rehab.  She is going to her balance class on Mondays only as it gives her two days to rest.  She is walking her dog on the other days of the week. She is doing more at home.   Claire Cortez feels that her strength and stamina is getting better.  Her kids have begun to notice a difference!  She thinks she is losing inches but not weight.  Claire Cortez has been doing well in rehab.  She is now up to level 5 on the NuStep and level 7 on the XR.   We will continue to monitor her progression.     Expected Outcomes Short: Increase workloads on XR and NuStep.  Long: Make exercise part of daily habit. Short: Continue to increase METs.  Long: Continue to exercise regularly Short: Continue to exercise on days off and work on increasing METs on seated equipment.  Long: Continue to exercise regularly.  Short:  Try to add more incline on treadmill.  Long: Continue to work on increasing her strength and stamina.        Discharge Exercise Prescription (Final Exercise Prescription Changes):     Exercise Prescription Changes - 08/09/17 1500      Response to Exercise   Blood Pressure (Admit) 102/67   Blood Pressure (Exit) 126/70   Heart Rate (Admit) 100 bpm   Heart Rate (Exercise) 118 bpm   Heart Rate (Exit) 106 bpm   Oxygen Saturation (Admit) 96 %   Oxygen Saturation (Exercise) 94 %   Oxygen Saturation (Exit) 94 %   Rating of Perceived Exertion (Exercise) 12   Perceived Dyspnea (Exercise) 1    Symptoms none   Duration Continue with 45 min of aerobic exercise without signs/symptoms of physical distress.   Intensity THRR unchanged     Progression   Progression Continue to progress workloads to maintain intensity without signs/symptoms of physical distress.   Average METs 3.66     Resistance Training   Training Prescription Yes   Weight 4 lbs   Reps 10-15     Interval Training   Interval Training No     Treadmill   MPH 2   Grade 0.5   Minutes 15   METs 2.67     NuStep   Level 5   Minutes 15   METs 3.3     REL-XR   Level 7   Minutes 15   METs 5     Home Exercise Plan   Plans to continue exercise at Home (comment)  walking, stationary bike, arthritis and balance class   Frequency Add 2 additional days to program exercise sessions.   Initial Home Exercises Provided 06/10/17      Nutrition:  Target Goals: Understanding of nutrition guidelines, daily intake of sodium <1570m, cholesterol <2068m calories 30% from fat and 7% or less from saturated fats, daily to have 5 or more servings of fruits and vegetables.  Biometrics:     Pre Biometrics - 05/24/17 1407      Pre Biometrics   Height 5' 4.8" (1.646 m)   Weight 217 lb 1.6 oz (98.5 kg)   Waist Circumference 40 inches   Hip Circumference 50.5 inches   Waist to Hip Ratio 0.79 %   BMI (Calculated) 36.4         Post Biometrics - 08/19/17 1209       Post  Biometrics   Height 5' 4.8" (1.646 m)   Weight 217 lb (98.4 kg)   Waist Circumference 42 inches   Hip Circumference 52 inches   Waist to Hip Ratio 0.81 %   BMI (Calculated) 36.33      Nutrition Therapy Plan and Nutrition Goals:     Nutrition Therapy & Goals - 08/03/17 1235      Nutrition Therapy   RD appointment defered Yes      Nutrition Discharge: Rate Your Plate Scores:   Nutrition Goals Re-Evaluation:     Nutrition Goals Re-Evaluation    Row Name 06/20/17 1304 07/08/17 1233 08/03/17 1235         Goals   Current Weight 218  lb (98.9 kg)  - 217 lb (98.4 kg)     Nutrition Goal  -  - Lose weight     Comment Patient does not want to see dietician at this time, but reports that she does read food labels and tries to watch protion sizes. She aims to eat 6 small meals a  day.  Claire Cortez does not wish to meet with the dietician. She reports knowing what to eat, just struggles with preparing food for one. She is reading labels and trying new recipes.  Declines nuttion appointment.  Claire Cortez continues to eat alone and eats two meals a day and has one snack a day.  She knows what to eat, but struggles to prep for one. We talked about prepping for whole week.  She is also going to start using My Fitness Pal to help track.  She is going to aim for better snacking.      Expected Outcome  -  - Short: Start using My Fitness Pal.  Long: Continue to work on diet to lose weight.         Nutrition Goals Discharge (Final Nutrition Goals Re-Evaluation):     Nutrition Goals Re-Evaluation - 08/03/17 1235      Goals   Current Weight 217 lb (98.4 kg)   Nutrition Goal Lose weight   Comment Declines nuttion appointment.  Claire Cortez continues to eat alone and eats two meals a day and has one snack a day.  She knows what to eat, but struggles to prep for one. We talked about prepping for whole week.  She is also going to start using My Fitness Pal to help track.  She is going to aim for better snacking.    Expected Outcome Short: Start using My Fitness Pal.  Long: Continue to work on diet to lose weight.       Psychosocial: Target Goals: Acknowledge presence or absence of significant depression and/or stress, maximize coping skills, provide positive support system. Participant is able to verbalize types and ability to use techniques and skills needed for reducing stress and depression.   Initial Review & Psychosocial Screening:     Initial Psych Review & Screening - 05/24/17 1334      Initial Review   Current issues with None Identified     Family  Dynamics   Good Support System? Yes     Barriers   Psychosocial barriers to participate in program There are no identifiable barriers or psychosocial needs.     Screening Interventions   Interventions Encouraged to exercise      Quality of Life Scores:     Quality of Life - 08/17/17 1152      Quality of Life Scores   Health/Function Pre 20.79 %   Health/Function Post 27 %   Health/Function % Change 29.87 %   Socioeconomic Pre 23.33 %   Socioeconomic Post 28 %   Socioeconomic % Change  20.02 %   Psych/Spiritual Pre 23.57 %   Psych/Spiritual Post 29.14 %   Psych/Spiritual % Change 23.63 %   Family Pre 27.5 %   Family Post 30 %   Family % Change 9.09 %   GLOBAL Pre 22.62 %   GLOBAL Post 28.14 %   GLOBAL % Change 24.4 %      PHQ-9: Recent Review Flowsheet Data    Depression screen [facility] 2/9 08/17/2017 08/03/2017 05/24/2017   Decreased Interest 0 0 0   Down, Depressed, Hopeless 0 0 0   PHQ - 2 Score 0 0 0   Altered sleeping '2 1 2   ' Tired, decreased energy '2 3  2   ' Change in appetite 1 0 1   Feeling bad or failure about yourself  0 0 0   Trouble concentrating 0 0 0   Moving slowly or fidgety/restless 0 0 0  Suicidal thoughts 0 0 0   PHQ-9 Score '5 4 5   ' Difficult doing work/chores Not difficult at all Not difficult at all Not difficult at all     Interpretation of Total Score  Total Score Depression Severity:  1-4 = Minimal depression, 5-9 = Mild depression, 10-14 = Moderate depression, 15-19 = Moderately severe depression, 20-27 = Severe depression   Psychosocial Evaluation and Intervention:     Psychosocial Evaluation - 06/08/17 1244      Psychosocial Evaluation & Interventions   Interventions Encouraged to exercise with the program and follow exercise prescription;Relaxation education;Stress management education   Comments Counselor met with Ms. Raenette Rover Claire Cortez) today for initial psychosocial evaluation.  She is an 83 year old who struggles with COPD.   Claire Cortez  lives alone but has a daughter and her family who live locally and are in contact with her often.  Claire Cortez has had open heart surgery in the past and a knee replacement 13 months ago that she reports "still hurts."  She reports sleeping "not great" which has been going on for quite some time with difficulty getting to sleep initially - but getting approximately 6 hours when she does.  She denies a history of depression or anxiety or any current symptoms and states she is typically in a positive mood with minimal stress in her life.  Claire Cortez has goals to breathe better and lose some weight while in this program.  Staff will follow through the course of the program with Claire Cortez.   Expected Outcomes Claire Cortez will benefit from consistent exercise to achieve her stated goals.  Meeting with the dietician to address the weight loss goals will be helpful.  Staff will follow   Continue Psychosocial Services  Follow up required by staff      Psychosocial Re-Evaluation:     Psychosocial Re-Evaluation    [place] Name 06/20/17 1306 07/06/17 1244 07/08/17 1236 08/03/17 1247       Psychosocial Re-Evaluation   Current issues with Current Sleep Concerns  - None Identified Current Stress Concerns    Comments Patient reports she is still under little stress and has a positive attitude most of the time. Her only concern is that she has a hard time getting to sleep and only sleeps about 6 hours a night (no change since last assessment) Claire Cortez reports enjoying this class and her family and she are noticing positive benefits as she is experiencing less shortness of breath and able to do more.  She continues to sleep about 6 hours a night and reports this is enough currently and she manages her stress well with exercise and a positive attitude.  Counselor commended [employer] for her progress made in such a short time.   Claire Cortez is very encouraged at the progress she is making. She thoroughly enjoys coming to class and everything she is  learning. She enjoys walking her dog, spending time with family, and attending classes at the [facility].  Claire Cortez continues to enjoy coming to class.  She is even going out to lunch after class with several of her classmates.  She continues to walk her dog and go to class for exercise.    She is stressed by her lack of weight loss.  She is also dealing with getting a new air conditioner at home.  She remains postivie and  has a good attitude overall.  She continues to sleep well from 2am -8am.      Expected Outcomes Short: wants to try listening  to soft music before bed to help her fall asleep. Long: establish better evening habits to help her fall asleep more consistantly  Continue to exercise and work on sleep strategies.   Short: continue to exercise at [place]; Long: continue the positive lifestyle changes she is learning and apply to life at home Short: Continue to attend class and meet with friends for support.  Resolve air conditioner unit at home.  Long: Maintain postive outlook on life.     Interventions  -  - Encouraged to attend Pulmonary Rehabilitation for the exercise Stress management education;Encouraged to attend Pulmonary Rehabilitation for the exercise;Relaxation education    Continue Psychosocial Services  Follow up required by staff  -  - Follow up required by staff      Initial Review   Source of Stress Concerns  -  -  - Chronic Illness       Psychosocial Discharge (Final Psychosocial Re-Evaluation):     Psychosocial Re-Evaluation - 08/03/17 1247      Psychosocial Re-Evaluation   Current issues with Current Stress Concerns   Comments Claire Cortez continues to enjoy coming to class.  She is even going out to lunch after class with several of her classmates.  She continues to walk her dog and go to class for exercise.    She is stressed by her lack of weight loss.  She is also dealing with getting a new air conditioner at home.  She remains postivie and  has a good attitude overall.   She continues to sleep well from 2am -8am.     Expected Outcomes Short: Continue to attend class and meet with friends for support.  Resolve air conditioner unit at home.  Long: Maintain postive outlook on life.    Interventions Stress management education;Encouraged to attend Pulmonary Rehabilitation for the exercise;Relaxation education   Continue Psychosocial Services  Follow up required by staff     Initial Review   Source of Stress Concerns Chronic Illness      Education: Education Goals: Education classes will be provided on a weekly basis, covering required topics. Participant will state understanding/return demonstration of topics presented.  Learning Barriers/Preferences:     Learning Barriers/Preferences - 05/24/17 1328      Learning Barriers/Preferences   Learning Barriers None   Learning Preferences None      Education Topics: Initial Evaluation Education: - Verbal, written and demonstration of respiratory meds, RPE/PD scales, oximetry and breathing techniques. Instruction on use of nebulizers and MDIs: cleaning and proper use, rinsing mouth with steroid doses and importance of monitoring MDI activations.   Pulmonary Rehab from 08/17/2017 in [facility] Cardiac and Pulmonary Rehab  Date  05/24/17  Educator  [facility]  Instruction Review Code (retired)  2- meets goals/outcomes  Instruction Review Code  1- IT trainer Nutrition Guidelines/Fats and Fiber: -Group instruction provided by verbal, written material, models and posters to present the general guidelines for heart healthy nutrition. Gives an explanation and review of dietary fats and fiber.   Pulmonary Rehab from 08/17/2017 in [facility] Cardiac and Pulmonary Rehab  Date  08/15/17  Educator  CR  Instruction Review Code  1- Verbalizes Understanding      Controlling Sodium/Reading Food Labels: -Group verbal and written material supporting the discussion of sodium use in heart healthy nutrition.  Review and explanation with models, verbal and written materials for utilization of the food label.   Pulmonary Rehab from 08/17/2017 in [facility] Cardiac and Pulmonary Rehab  Date  06/27/17  Educator  CR  Instruction Review Code  1- Verbalizes Understanding      Exercise Physiology & Risk Factors: - Group verbal and written instruction with models to review the exercise physiology of the cardiovascular system and associated critical values. Details cardiovascular disease risk factors and the goals associated with each risk factor.   Pulmonary Rehab from 08/17/2017 in [facility] Cardiac and Pulmonary Rehab  Date  08/05/17  Educator  [facility]  Instruction Review Code  1- Verbalizes Understanding      Aerobic Exercise & Resistance Training: - Gives group verbal and written discussion on the health impact of inactivity. On the components of aerobic and resistive training programs and the benefits of this training and how to safely progress through these programs.   Flexibility, Balance, General Exercise Guidelines: - Provides group verbal and written instruction on the benefits of flexibility and balance training programs. Provides general exercise guidelines with specific guidelines to those with heart or lung disease. Demonstration and skill practice provided.   Pulmonary Rehab from 08/17/2017 in [facility] Cardiac and Pulmonary Rehab  Date  06/15/17  Educator  AS  Instruction Review Code  1- Verbalizes Understanding      Stress Management: - Provides group verbal and written instruction about the health risks of elevated stress, cause of high stress, and healthy ways to reduce stress.   Pulmonary Rehab from 08/17/2017 in [facility] Cardiac and Pulmonary Rehab  Date  07/20/17  Educator  [facility]  Instruction Review Code  1- Verbalizes Understanding      Depression: - Provides group verbal and written instruction on the correlation between heart/lung disease and depressed mood, treatment options, and the stigmas  associated with seeking treatment.   Pulmonary Rehab from 08/17/2017 in [facility] Cardiac and Pulmonary Rehab  Date  06/22/17  Educator  [facility]  Instruction Review Code (retired)  2- meets goals/outcomes  Instruction Review Code  1- Science writer Understanding      Exercise & Equipment Safety: - Individual verbal instruction and demonstration of equipment use and safety with use of the equipment.   Infection Prevention: - Provides verbal and written material to individual with discussion of infection control including proper hand washing and proper equipment cleaning during exercise session.   Pulmonary Rehab from 08/17/2017 in [facility] Cardiac and Pulmonary Rehab  Date  05/24/17  Educator  [facility]  Instruction Review Code  1- Verbalizes Understanding      Falls Prevention: - Provides verbal and written material to individual with discussion of falls prevention and safety.   Pulmonary Rehab from 08/17/2017 in [facility] Cardiac and Pulmonary Rehab  Date  05/24/17  Educator  [facility]  Instruction Review Code (retired)  2- meets goals/outcomes  Instruction Review Code  1- Science writer Understanding      Diabetes: - Individual verbal and written instruction to review signs/symptoms of diabetes, desired ranges of glucose level fasting, after meals and with exercise. Advice that pre and post exercise glucose checks will be done for 3 sessions at entry of program.   Chronic Lung Diseases: - Group verbal and written instruction to review new updates, new respiratory medications, new advancements in procedures and treatments. Provide informative websites and "800" numbers of self-education.   Pulmonary Rehab from 08/17/2017 in [facility] Cardiac and Pulmonary Rehab  Date  08/03/17  Educator  [facility]  Instruction Review Code  1- Verbalizes Understanding      Lung Procedures: - Group verbal and written instruction to describe testing methods done to diagnose lung disease. Review the outcome  of test results. Describe the  treatment choices: Pulmonary Function Tests, ABGs and oximetry.   Energy Conservation: - Provide group verbal and written instruction for methods to conserve energy, plan and organize activities. Instruct on pacing techniques, use of adaptive equipment and posture/positioning to relieve shortness of breath.   Triggers: - Group verbal and written instruction to review types of environmental controls: home humidity, furnaces, filters, dust mite/pet prevention, HEPA vacuums. To discuss weather changes, air quality and the benefits of nasal washing.   Exacerbations: - Group verbal and written instruction to provide: warning signs, infection symptoms, calling MD promptly, preventive modes, and value of vaccinations. Review: effective airway clearance, coughing and/or vibration techniques. Create an Sports administrator.   Pulmonary Rehab from 08/17/2017 in [facility] Cardiac and Pulmonary Rehab  Date  06/01/17  Educator  [facility]  Instruction Review Code  1- Verbalizes Understanding      Oxygen: - Individual and group verbal and written instruction on oxygen therapy. Includes supplement oxygen, available portable oxygen systems, continuous and intermittent flow rates, oxygen safety, concentrators, and Medicare reimbursement for oxygen.   Respiratory Medications: - Group verbal and written instruction to review medications for lung disease. Drug class, frequency, complications, importance of spacers, rinsing mouth after steroid MDI's, and proper cleaning methods for nebulizers.   Pulmonary Rehab from 08/17/2017 in [facility] Cardiac and Pulmonary Rehab  Date  05/24/17  Educator  [facility]  Instruction Review Code (retired)  2- meets goals/outcomes  Instruction Review Code  1- Science writer Understanding      AED/CPR: - Group verbal and written instruction with the use of models to demonstrate the basic use of the AED with the basic ABC's of resuscitation.   Pulmonary Rehab from 08/17/2017 in [facility] Cardiac and Pulmonary  Rehab  Date  07/22/17  Educator  CE  Instruction Review Code  1- Verbalizes Understanding      Breathing Retraining: - Provides individuals verbal and written instruction on purpose, frequency, and proper technique of diaphragmatic breathing and pursed-lipped breathing. Applies individual practice skills.   Pulmonary Rehab from 08/17/2017 in [facility] Cardiac and Pulmonary Rehab  Date  06/01/17  Educator  [facility]  Instruction Review Code  1- Verbalizes Understanding      Anatomy and Physiology of the Lungs: - Group verbal and written instruction with the use of models to provide basic lung anatomy and physiology related to function, structure and complications of lung disease.   Pulmonary Rehab from 08/17/2017 in [facility] Cardiac and Pulmonary Rehab  Date  07/06/17  Educator  [facility]  Instruction Review Code  1- Verbalizes Understanding      Anatomy & Physiology of the Heart: - Group verbal and written instruction and models provide basic cardiac anatomy and physiology, with the coronary electrical and arterial systems. Review of: AMI, Angina, Valve disease, Heart Failure, Cardiac Arrhythmia, Pacemakers, and the ICD.   Pulmonary Rehab from 08/17/2017 in [facility] Cardiac and Pulmonary Rehab  Date  06/10/17  Educator  [facility]  Instruction Review Code  1- Verbalizes Understanding      Heart Failure: - Group verbal and written instruction on the basics of heart failure: signs/symptoms, treatments, explanation of ejection fraction, enlarged heart and cardiomyopathy.   Pulmonary Rehab from 08/17/2017 in [facility] Cardiac and Pulmonary Rehab  Date  06/10/17  Educator  [facility]  Instruction Review Code  1- Verbalizes Understanding      Sleep Apnea: - Individual verbal and written instruction to review Obstructive Sleep Apnea. Review of risk factors, methods for diagnosing and types of  masks and machines for OSA.   Anxiety: - Provides group, verbal and written instruction on the correlation between heart/lung  disease and anxiety, treatment options, and management of anxiety.   Pulmonary Rehab from 08/17/2017 in [facility] Cardiac and Pulmonary Rehab  Date  07/20/17  Educator  [facility]  Instruction Review Code  1- Verbalizes Understanding      Relaxation: - Provides group, verbal and written instruction about the benefits of relaxation for patients with heart/lung disease. Also provides patients with examples of relaxation techniques.   Pulmonary Rehab from 08/17/2017 in [facility] Cardiac and Pulmonary Rehab  Date  08/17/17  Educator  [facility]  Instruction Review Code  1- Verbalizes Understanding      Cardiac Medications: - Group verbal and written instruction to review commonly prescribed medications for heart disease. Reviews the medication, class of the drug, and side effects.   Pulmonary Rehab from 08/17/2017 in [facility] Cardiac and Pulmonary Rehab  Date  07/08/17  Educator  CE  Instruction Review Code  1- Verbalizes Understanding      Know Your Numbers: -Group verbal and written instruction about important numbers in your health.  Review of Cholesterol, Blood Pressure, Diabetes, and BMI and the role they play in your overall health.   Pulmonary Rehab from 08/17/2017 in [facility] Cardiac and Pulmonary Rehab  Date  08/12/17  Educator  [facility]  Instruction Review Code  1- Verbalizes Understanding      Other: -Provides group and verbal instruction on various topics (see comments)    Knowledge Questionnaire Score:     Knowledge Questionnaire Score - 08/17/17 1151      Knowledge Questionnaire Score   Pre Score 7/10   Post Score 8/10  Reviewed with patient       Core Components/Risk Factors/Patient Goals at Admission:     Personal Goals and Risk Factors at Admission - 05/24/17 1320      Core Components/Risk Factors/Patient Goals on Admission    Weight Management Yes;Obesity   Intervention Weight Management: Develop a combined nutrition and exercise program designed to reach desired caloric intake,  while maintaining appropriate intake of nutrient and fiber, sodium and fats, and appropriate energy expenditure required for the weight goal.;Weight Management: Provide education and appropriate resources to help participant work on and attain dietary goals.;Weight Management/Obesity: Establish reasonable short term and long term weight goals.;Obesity: Provide education and appropriate resources to help participant work on and attain dietary goals.   Admit Weight 217 lb 1.6 oz (98.5 kg)   Goal Weight: Short Term 212 lb (96.2 kg)   Goal Weight: Long Term 180 lb (81.6 kg)   Expected Outcomes Short Term: Continue to assess and modify interventions until short term weight is achieved;Long Term: Adherence to nutrition and physical activity/exercise program aimed toward attainment of established weight goal;Weight Maintenance: Understanding of the daily nutrition guidelines, which includes 25-35% calories from fat, 7% or less cal from saturated fats, less than 26m cholesterol, less than 1.5gm of sodium, & 5 or more servings of fruits and vegetables daily;Weight Loss: Understanding of general recommendations for a balanced deficit meal plan, which promotes 1-2 lb weight loss per week and includes a negative energy balance of [phone] kcal/d;Understanding recommendations for meals to include 15-35% energy as protein, 25-35% energy from fat, 35-60% energy from carbohydrates, less than 2087mof dietary cholesterol, 20-35 gm of total fiber daily;Understanding of distribution of calorie intake throughout the day with the consumption of 4-5 meals/snacks   Tobacco Cessation --  patient does not smoke  Improve shortness of breath with ADL's Yes   Intervention Provide education, individualized exercise plan and daily activity instruction to help decrease symptoms of SOB with activities of daily living.   Expected Outcomes Short Term: Achieves a reduction of symptoms when performing activities of daily living.    Develop more efficient breathing techniques such as purse lipped breathing and diaphragmatic breathing; and practicing self-pacing with activity Yes   Intervention Provide education, demonstration and support about specific breathing techniuqes utilized for more efficient breathing. Include techniques such as pursed lipped breathing, diaphragmatic breathing and self-pacing activity.   Expected Outcomes Short Term: Participant will be able to demonstrate and use breathing techniques as needed throughout daily activities.   Increase knowledge of respiratory medications and ability to use respiratory devices properly  Yes   Intervention Provide education and demonstration as needed of appropriate use of medications, inhalers, and oxygen therapy.   Expected Outcomes Short Term: Achieves understanding of medications use. Understands that oxygen is a medication prescribed by physician. Demonstrates appropriate use of inhaler and oxygen therapy.   Diabetes Yes  Pre diabetic   Intervention Provide education about signs/symptoms and action to take for hypo/hyperglycemia.;Provide education about proper nutrition, including hydration, and aerobic/resistive exercise prescription along with prescribed medications to achieve blood glucose in normal ranges: Fasting glucose 65-99 mg/dL   Expected Outcomes Short Term: Participant verbalizes understanding of the signs/symptoms and immediate care of hyper/hypoglycemia, proper foot care and importance of medication, aerobic/resistive exercise and nutrition plan for blood glucose control.;Long Term: Attainment of HbA1C < 7%.   Heart Failure Yes   Intervention Provide a combined exercise and nutrition program that is supplemented with education, support and counseling about heart failure. Directed toward relieving symptoms such as shortness of breath, decreased exercise tolerance, and extremity edema.   Expected Outcomes Improve functional capacity of life;Short term:  Attendance in program 2-3 days a week with increased exercise capacity. Reported lower sodium intake. Reported increased fruit and vegetable intake. Reports medication compliance.;Short term: Daily weights obtained and reported for increase. Utilizing diuretic protocols set by physician.;Long term: Adoption of self-care skills and reduction of barriers for early signs and symptoms recognition and intervention leading to self-care maintenance.   Lipids Yes   Intervention Provide education and support for participant on nutrition & aerobic/resistive exercise along with prescribed medications to achieve LDL <64m, HDL >481m   Expected Outcomes Short Term: Participant states understanding of desired cholesterol values and is compliant with medications prescribed. Participant is following exercise prescription and nutrition guidelines.;Long Term: Cholesterol controlled with medications as prescribed, with individualized exercise RX and with personalized nutrition plan. Value goals: LDL < 7063mHDL > 40 mg.      Core Components/Risk Factors/Patient Goals Review:      Goals and Risk Factor Review    Row Name 06/20/17 1300 07/08/17 1222 08/03/17 1231         Core Components/Risk Factors/Patient Goals Review   Personal Goals Review Weight Management/Obesity;Diabetes;Hypertension;Lipids Weight Management/Obesity;Lipids;Hypertension;Improve shortness of breath with ADL's Weight Management/Obesity;Lipids;Hypertension;Improve shortness of breath with ADL's     Review Patient wants to continue to lose weight and is considered pre-diabetic. She recently had lab work done and the doctor told her the results were good.  BobTammi Klippelports she has lost a couple pounds but wishes to keep losing weight. She revealed struggles to cook for one at home but is looking into different nutrition options. She did not want to meet with the nutritionist just yet, but knows that it is an  option. She has noticed her decrease in blood  pressure and her doctor is happy about her lab values so far. Her ability to do things at home and out has increased since starting class and she really is excited to see what else she can start doing with her improvement.  Claire Cortez's weight has been fairly steady.  She feels that she is losing inches but not losing weight.  She continues to eat by herself at home.  Still declining to meet with nutrtionist.  Blood pressures have been going and continues to check at home.  She is doing more at home with less SOB.  Medications seems to working for her and doing well.      Expected Outcomes Short: 1-2 lbs weight loss per week. Long: continue exercise programs and healthy dietary choices to control BP, blood sugars, and lipid panels.  Short: lose 1-3 pounds in the next couple weeks by eating healthier, continue improvement with SOB by attending [facility] and exercising at home on her bike and walking her dog ; Long: lose 30 pounds, continue healthier lifestyle choices after [facility] Short: Continue to work on weight loss at 1-2 lbs a week with watching her diet.  Long: Continue to work on risk factor modification.         Core Components/Risk Factors/Patient Goals at Discharge (Final Review):      Goals and Risk Factor Review - 08/03/17 1231      Core Components/Risk Factors/Patient Goals Review   Personal Goals Review Weight Management/Obesity;Lipids;Hypertension;Improve shortness of breath with ADL's   Review Claire Cortez's weight has been fairly steady.  She feels that she is losing inches but not losing weight.  She continues to eat by herself at home.  Still declining to meet with nutrtionist.  Blood pressures have been going and continues to check at home.  She is doing more at home with less SOB.  Medications seems to working for her and doing well.    Expected Outcomes Short: Continue to work on weight loss at 1-2 lbs a week with watching her diet.  Long: Continue to work on risk factor modification.        ITP Comments:     ITP Comments    Row Name 05/24/17 1315 05/30/17 0836 06/27/17 0843 07/25/17 0841 08/22/17 0837   ITP Comments Medical evaluation completed. Visit diagnosis can be found in [facility] encounter 05/24/17, Initial ITP sent to Dr. Emily Filbert director of [place] for review and changes. 30 day review completed ITP sent to Dr. Ramonita Lab for Dr. Emily Filbert Director of [place]. Continue with ITP unless changes are made by physician. New to program has not started sessions. 30 day review completed. ITP sent to Dr. Emily Filbert Director of [place]. Continue with ITP unless changes are made by physician.   30 day review completed. ITP sent to Dr. Emily Filbert Director of [place]. Continue with ITP unless changes are made by physician.   30 day review completed. ITP sent to Dr. Emily Filbert Director of [place]. Continue with ITP unless changes are made by physician.        Comments: 30 day review

## 2017-08-22 NOTE — Progress Notes (Signed)
Daily Session Note  Patient Details  Name: Claire Cortez MRN: [id number] Date of Birth: 02-Dec-1933 Referring Provider:     Pulmonary Rehab from 05/24/2017 in [facility] Cardiac and Pulmonary Rehab  Referring Provider  Ancil Linsey MD      Encounter Date: 08/22/2017  Check In:     Session Check In - 08/22/17 1145      Check-In   Location [facility]-Cardiac & Pulmonary Rehab   Staff Present Nada Maclachlan, BA, ACSM CEP, Exercise Physiologist;Kelly Amedeo Plenty, BS, ACSM CEP, Exercise Physiologist;Andren Bethea Flavia Shipper   Supervising physician immediately available to respond to emergencies [facility] immediately available ER MD   Physician(s) Dr. Kerman Passey and Mariea Clonts   Medication changes reported     No   Fall or balance concerns reported    No   Warm-up and Cool-down Performed as group-led instruction   Resistance Training Performed Yes   VAD Patient? No     Pain Assessment   Currently in Pain? No/denies   Multiple Pain Sites No         History  Smoking Status  . Former Smoker  . Packs/day: 1.50  . Years: 15.00  . Types: Cigarettes  . Quit date: 08/25/1974  Smokeless Tobacco  . Never Used    Comment: patient has not smoked since 1975    Goals Met:  Independence with exercise equipment Exercise tolerated well No report of cardiac concerns or symptoms Strength training completed today  Goals Unmet:  Not Applicable  Comments: Pt able to follow exercise prescription today without complaint.  Will continue to monitor for progression.   Dr. Emily Filbert is Medical Director for [place] and [facility].

## 2017-08-24 DIAGNOSIS — J449 Chronic obstructive pulmonary disease, unspecified: Secondary | ICD-10-CM

## 2017-08-24 NOTE — Progress Notes (Signed)
Daily Session Note  Patient Details  Name: Zierra Laroque MRN: [id number] Date of Birth: 1934/09/07 Referring Provider:     Pulmonary Rehab from 05/24/2017 in [facility] Cardiac and Pulmonary Rehab  Referring Provider  Ancil Linsey MD      Encounter Date: 08/24/2017  Check In:     Session Check In - 08/24/17 1145      Check-In   Location [facility]-Cardiac & Pulmonary Rehab   Staff Present Alberteen Sam, MA, ACSM RCEP, Exercise Physiologist;Meredith Sherryll Burger, RN BSN;Cary Wilford Flavia Shipper   Supervising physician immediately available to respond to emergencies [facility] immediately available ER MD   Physician(s) Dr. Jimmye Norman and Clearnce Hasten   Medication changes reported     No   Fall or balance concerns reported    No   Warm-up and Cool-down Performed as group-led instruction   Resistance Training Performed Yes   VAD Patient? No     Pain Assessment   Currently in Pain? No/denies         History  Smoking Status  . Former Smoker  . Packs/day: 1.50  . Years: 15.00  . Types: Cigarettes  . Quit date: 08/25/1974  Smokeless Tobacco  . Never Used    Comment: patient has not smoked since 1975    Goals Met:  Proper associated with RPD/PD & O2 Sat Independence with exercise equipment Using PLB without cueing & demonstrates good technique Exercise tolerated well Strength training completed today  Goals Unmet:  Not Applicable  Comments: Pt able to follow exercise prescription today without complaint.  Will continue to monitor for progression.    Dr. Emily Filbert is Medical Director for [place] and [facility].

## 2017-08-26 DIAGNOSIS — J449 Chronic obstructive pulmonary disease, unspecified: Secondary | ICD-10-CM

## 2017-08-26 NOTE — Progress Notes (Signed)
Daily Session Note  Patient Details  Name: Claire Cortez MRN: [id number] Date of Birth: 08/13/1934 Referring Provider:     Pulmonary Rehab from 05/24/2017 in [facility] Cardiac and Pulmonary Rehab  Referring Provider  Ancil Linsey MD      Encounter Date: 08/26/2017  Check In:     Session Check In - 08/26/17 1156      Check-In   Location [facility]-Cardiac & Pulmonary Rehab   Staff Present Alberteen Sam, MA, ACSM RCEP, Exercise Physiologist;Ciji Boston Alcus Dad, RN BSN   Supervising physician immediately available to respond to emergencies [facility] immediately available ER MD   Physician(s) Dr. Alfred Levins and [place]   Medication changes reported     No   Fall or balance concerns reported    No   Warm-up and Cool-down Performed as group-led instruction   Resistance Training Performed Yes   VAD Patient? No     Pain Assessment   Currently in Pain? No/denies         History  Smoking Status  . Former Smoker  . Packs/day: 1.50  . Years: 15.00  . Types: Cigarettes  . Quit date: 08/25/1974  Smokeless Tobacco  . Never Used    Comment: patient has not smoked since 1975    Goals Met:  Proper associated with RPD/PD & O2 Sat Independence with exercise equipment Exercise tolerated well No report of cardiac concerns or symptoms Strength training completed today  Goals Unmet:  Not Applicable  Comments: Pt able to follow exercise prescription today without complaint.  Will continue to monitor for progression.   Dr. Emily Filbert is Medical Director for [place] and [facility].

## 2017-08-29 DIAGNOSIS — J449 Chronic obstructive pulmonary disease, unspecified: Secondary | ICD-10-CM | POA: Diagnosis not present

## 2017-08-29 NOTE — Progress Notes (Signed)
Daily Session Note  Patient Details  Name: Claire Cortez MRN: [id number] Date of Birth: 01/19/1934 Referring Provider:     Pulmonary Rehab from 05/24/2017 in [facility] Cardiac and Pulmonary Rehab  Referring Provider  Fleming, [place] MD      Encounter Date: 08/29/2017  Check In:     Session Check In - 08/29/17 1145      Check-In   Location [facility]-Cardiac & Pulmonary Rehab   Staff Present Amanda Sommer, BA, ACSM CEP, Exercise Physiologist;Kelly Hayes, BS, ACSM CEP, Exercise Physiologist;Joseph Hood RCP,RRT,BSRT   Supervising physician immediately available to respond to emergencies [facility] immediately available ER MD   Physician(s) Dr. Malinda and Lord   Medication changes reported     No   Fall or balance concerns reported    No   Warm-up and Cool-down Performed as group-led instruction   Resistance Training Performed Yes   VAD Patient? No     Pain Assessment   Currently in Pain? No/denies         History  Smoking Status  . Former Smoker  . Packs/day: 1.50  . Years: 15.00  . Types: Cigarettes  . Quit date: 08/25/1974  Smokeless Tobacco  . Never Used    Comment: patient has not smoked since 1975    Goals Met:  Proper associated with RPD/PD & O2 Sat Independence with exercise equipment Exercise tolerated well No report of cardiac concerns or symptoms Strength training completed today  Goals Unmet:  Not Applicable  Comments:  Vernida graduated today from cardiac rehab with 36 sessions completed.  Details of the patient's exercise prescription and what She needs to do in order to continue the prescription and progress were discussed with patient.  Patient was given a copy of prescription and goals.  Patient verbalized understanding.  Libi plans to continue to exercise by joining [employer].   Dr. Mark Miller is Medical Director for [facility] and [facility]. 

## 2017-08-29 NOTE — Progress Notes (Signed)
Pulmonary Individual Treatment Plan  Patient Details  Name: Claire Cortez MRN: [id number] Date of Birth: [birth year] Referring Provider:     Pulmonary Rehab from 05/24/2017 in [facility] Cardiac and Pulmonary Rehab  Referring Provider  Ancil Linsey MD      Initial Encounter Date:    Pulmonary Rehab from 05/24/2017 in [facility] Cardiac and Pulmonary Rehab  Date  05/24/17  Referring Provider  Ancil Linsey MD      Visit Diagnosis: Chronic obstructive pulmonary disease, unspecified COPD type ([place])  Patient's Home Medications on Admission:  Current Outpatient Prescriptions:  .  amitriptyline (ELAVIL) 25 MG tablet, Take by mouth., Disp: , Rfl:  .  aspirin EC 81 MG tablet, Take by mouth., Disp: , Rfl:  .  CO ENZYME Q-10 PO, Take 200 mg by mouth., Disp: , Rfl:  .  conjugated estrogens (PREMARIN) vaginal cream, Apply blueberry sized amount to vaginal opening with finger tip M-W-F nights., Disp: 42.5 g, Rfl: 12 .  Glucosamine HCl (SM GLUCOSAMINE HCL) 1500 MG TABS, Take by mouth., Disp: , Rfl:  .  levothyroxine (SYNTHROID, LEVOTHROID) 88 MCG tablet, TAKE 1 TABLET DAILY ON AN EMPTY STOMACH WITH A GLASS OF WATER AT LEAST 30 TO 60 MINUTES BEFORE BREAKFAST, Disp: , Rfl:  .  lisinopril (PRINIVIL,ZESTRIL) 10 MG tablet, TAKE 1 TABLET DAILY, Disp: , Rfl:  .  Multiple Vitamin (MULTI-VITAMINS) TABS, Take by mouth., Disp: , Rfl:  .  Omega-3 Fatty Acids (FISH OIL) 1200 MG CAPS, Take by mouth., Disp: , Rfl:  .  rosuvastatin (CRESTOR) 10 MG tablet, TAKE 1 TABLET NIGHTLY, Disp: , Rfl:  .  solifenacin (VESICARE) 5 MG tablet, Take 1 tablet (5 mg total) by mouth daily., Disp: 30 tablet, Rfl: 3 .  traMADol (ULTRAM) 50 MG tablet, Take by mouth., Disp: , Rfl:   Past Medical History: Past Medical History:  Diagnosis Date  . Acquired hypothyroidism 08/25/2015  . Arteriosclerosis of coronary artery 08/25/2015   Overview:  Sp cabg x3 with graft stent 2007   . Arthritis of knee, degenerative 04/10/2015  . Benign  essential HTN 10/24/2014  . Borderline diabetes mellitus 08/25/2015  . Chronic cervical pain 08/25/2015  . Chronic diarrhea 09/24/2014  . Combined fat and carbohydrate induced hyperlipemia 10/24/2014    Tobacco Use: History  Smoking Status  . Former Smoker  . Packs/day: 1.50  . Years: 15.00  . Types: Cigarettes  . Quit date: 08/25/1974  Smokeless Tobacco  . Never Used    Comment: patient has not smoked since 1975    Labs: Recent Review Flowsheet Data    There is no flowsheet data to display.       Pulmonary Assessment Scores:     Pulmonary Assessment Scores    Row Name 05/24/17 1334 07/01/17 1254 08/17/17 1150     ADL UCSD   ADL Phase Entry Mid Exit   SOB Score total 32 41 18   Rest 1 1 0   Walk 2 2 0   Stairs '3 2 1   ' Bath 0 1 0   Dress 0 1 0   Shop '3 3 1     ' mMRC Score   mMRC Score 1  -  -   Row Name 08/19/17 1212         mMRC Score   mMRC Score 0        Pulmonary Function Assessment:     Pulmonary Function Assessment - 05/24/17 1330      Pulmonary Function Tests  FVC% 76 %   FEV1% 76 %   FEV1/FVC Ratio 72     Breath   Bilateral Breath Sounds Clear   Shortness of Breath Fear of Shortness of Breath      Exercise Target Goals:    Exercise Program Goal: Individual exercise prescription set with THRR, safety & activity barriers. Participant demonstrates ability to understand and report RPE using BORG scale, to self-measure pulse accurately, and to acknowledge the importance of the exercise prescription.  Exercise Prescription Goal: Starting with aerobic activity 30 plus minutes a day, 3 days per week for initial exercise prescription. Provide home exercise prescription and guidelines that participant acknowledges understanding prior to discharge.  Activity Barriers & Risk Stratification:     Activity Barriers & Cardiac Risk Stratification - 05/24/17 1341      Activity Barriers & Cardiac Risk Stratification   Activity Barriers Right  Knee Replacement;Deconditioning;Muscular Weakness;Shortness of Breath      6 Minute Walk:     6 Minute Walk    Row Name 05/24/17 1343 08/19/17 1209       6 Minute Walk   Phase Initial Discharge    Distance 970 feet 1106 feet    Distance % Change  - 14 %    Distance Feet Change  - 136 ft    Walk Time 4.98 minutes 5.5 minutes    # of Rest Breaks 2 1    MPH 2.21 2.28    METS 7.92 2.76    RPE 13 12    Perceived Dyspnea  2 1    VO2 Peak 6.72 8.01    Symptoms Yes (comment) Yes (comment)    Comments leg fatigue, compensating for Knee, SOB hip/leg pain 4/10    Resting HR 93 bpm 102 bpm    Resting BP 134/56 106/58    Resting Oxygen Saturation   - 96 %    Exercise Oxygen Saturation  during 6 min walk  - 90 %    Max Ex. HR 140 bpm 141 bpm    Max Ex. BP 162/70 174/70    2 Minute Post BP 154/72  -      Interval HR   Baseline HR (retired) 93  -    1 Minute HR 117  -    2 Minute HR 130 125    3 Minute HR 140 133    4 Minute HR 138 141    5 Minute HR 134 139    6 Minute HR 130 141    2 Minute Post HR 102 106    Interval Heart Rate? Yes  -      Interval Oxygen   Interval Oxygen? Yes  -    Baseline Oxygen Saturation % 92 % 96 %    Resting Liters of Oxygen 0 L  Room Air  -    1 Minute Oxygen Saturation % 92 %  -    1 Minute Liters of Oxygen 0 L 0 L    2 Minute Oxygen Saturation % 91 % 93 %    2 Minute Liters of Oxygen 0 L 0 L    3 Minute Oxygen Saturation % 90 % 94 %    3 Minute Liters of Oxygen 0 L 0 L    4 Minute Oxygen Saturation % 90 % 94 %    4 Minute Liters of Oxygen 0 L 0 L    5 Minute Oxygen Saturation % 93 % 90 %    5 Minute Liters  of Oxygen 0 L 0 L    6 Minute Oxygen Saturation % 92 % 93 %    6 Minute Liters of Oxygen 0 L 0 L    2 Minute Post Oxygen Saturation % 94 % 98 %    2 Minute Post Liters of Oxygen 0 L 0 L      Oxygen Initial Assessment:     Oxygen Initial Assessment - 05/24/17 1326      Home Oxygen   Home Oxygen Device Home Concentrator   Sleep  Oxygen Prescription Continuous   Liters per minute 2   Home Exercise Oxygen Prescription None   Home at Rest Exercise Oxygen Prescription None   Compliance with Home Oxygen Use Yes     Initial 6 min Walk   Oxygen Used None     Program Oxygen Prescription   Program Oxygen Prescription None     Intervention   Short Term Goals To learn and understand importance of monitoring SPO2 with pulse oximeter and demonstrate accurate use of the pulse oximeter.;To Learn and understand importance of maintaining oxygen saturations>88%;To learn and demonstrate proper purse lipped breathing techniques or other breathing techniques.;To learn and demonstrate proper use of respiratory medications   Long  Term Goals Maintenance of O2 saturations>88%;Compliance with respiratory medication;Demonstrates proper use of MDI's;Exhibits proper breathing techniques, such as purse lipped breathing or other method taught during program session;Verbalizes importance of monitoring SPO2 with pulse oximeter and return demonstration      Oxygen Re-Evaluation:     Oxygen Re-Evaluation    Row Name 06/01/17 1421 06/20/17 1311 07/08/17 1239 08/03/17 1244       Program Oxygen Prescription   Program Oxygen Prescription  - None None None      Home Oxygen   Home Oxygen Device  - Home Concentrator;E-Tanks  - Home Concentrator;E-Tanks    Sleep Oxygen Prescription  - Continuous Continuous Continuous    Liters per minute  - '2 2 2    ' Home Exercise Oxygen Prescription  - None None None    Home at Rest Exercise Oxygen Prescription  - None None None    Compliance with Home Oxygen Use  - Yes Yes Yes      Goals/Expected Outcomes   Short Term Goals To learn and demonstrate proper purse lipped breathing techniques or other breathing techniques. To learn and exhibit compliance with exercise, home and travel O2 prescription;To learn and understand importance of monitoring SPO2 with pulse oximeter and demonstrate accurate use of the  pulse oximeter.;To Learn and understand importance of maintaining oxygen saturations>88%;To learn and demonstrate proper purse lipped breathing techniques or other breathing techniques.;To learn and demonstrate proper use of respiratory medications To learn and understand importance of monitoring SPO2 with pulse oximeter and demonstrate accurate use of the pulse oximeter.;To learn and understand importance of maintaining oxygen saturations>88%;To learn and demonstrate proper use of respiratory medications;To learn and demonstrate proper pursed lip breathing techniques or other breathing techniques. To learn and understand importance of monitoring SPO2 with pulse oximeter and demonstrate accurate use of the pulse oximeter.;To learn and understand importance of maintaining oxygen saturations>88%;To learn and demonstrate proper use of respiratory medications;To learn and demonstrate proper pursed lip breathing techniques or other breathing techniques.;To learn and exhibit compliance with exercise, home and travel O2 prescription    Long  Term Goals Exhibits proper breathing techniques, such as purse lipped breathing or other method taught during program session Exhibits compliance with exercise, home and travel O2 prescription;Maintenance of O2 saturations>88%;Compliance  with respiratory medication;Verbalizes importance of monitoring SPO2 with pulse oximeter and return demonstration;Exhibits proper breathing techniques, such as purse lipped breathing or other method taught during program session;Demonstrates proper use of MDI's Verbalizes importance of monitoring SPO2 with pulse oximeter and return demonstration;Exhibits proper breathing techniques, such as pursed lip breathing or other method taught during program session;Compliance with respiratory medication;Maintenance of O2 saturations>88% Verbalizes importance of monitoring SPO2 with pulse oximeter and return demonstration;Exhibits proper breathing  techniques, such as pursed lip breathing or other method taught during program session;Compliance with respiratory medication;Maintenance of O2 saturations>88%;Exhibits compliance with exercise, home and travel O2 prescription    Comments Reviewed pursed lip breathing technique with Claire Cortez today.  Worked on how to use technique and reviewed how it is helpful in controlling breath and maintain oxygen saturations.   Reviewed PLB and when to use it to control SOB. She monitors Sats and home and maintains O2 above 90. She is complient with oxygen use at night. Dr. stated if she loses weight she may be able to come off oxygen at night.  She is compliant with home medications and home O2 at night. She uses her pursed lip breathing when she is SOB and has noticed how much it helps her.  Claire Cortez is compliants with her home oxygen at night.  She uses her PLB regularly and it has helped her be able to do more at home.  Her medications are working for her and only needs her emergency inhaler on occasion. She has a follow up appointmnet on 10/16 and they plan to set up another sleep study.     Goals/Expected Outcomes Short: Become more proficient at PLB.  Long: Become independent at using PLB. Short: continue to use PLB idependenly during daily activities. Long: weight loss that will possibly allow her to come off oxygen at night.  Short: continue practicing PLB, using inhalers as needed, and home O2; Long: utilize these skills after [facility] is completed Short: Meet with doctor on 10/16 to discuss oxygen use.  Long; Continue to use PLB and manange lung disease.       Oxygen Discharge (Final Oxygen Re-Evaluation):     Oxygen Re-Evaluation - 08/03/17 1244      Program Oxygen Prescription   Program Oxygen Prescription None     Home Oxygen   Home Oxygen Device Home Concentrator;E-Tanks   Sleep Oxygen Prescription Continuous   Liters per minute 2   Home Exercise Oxygen Prescription None   Home at Rest Exercise  Oxygen Prescription None   Compliance with Home Oxygen Use Yes     Goals/Expected Outcomes   Short Term Goals To learn and understand importance of monitoring SPO2 with pulse oximeter and demonstrate accurate use of the pulse oximeter.;To learn and understand importance of maintaining oxygen saturations>88%;To learn and demonstrate proper use of respiratory medications;To learn and demonstrate proper pursed lip breathing techniques or other breathing techniques.;To learn and exhibit compliance with exercise, home and travel O2 prescription   Long  Term Goals Verbalizes importance of monitoring SPO2 with pulse oximeter and return demonstration;Exhibits proper breathing techniques, such as pursed lip breathing or other method taught during program session;Compliance with respiratory medication;Maintenance of O2 saturations>88%;Exhibits compliance with exercise, home and travel O2 prescription   Comments Claire Cortez is compliants with her home oxygen at night.  She uses her PLB regularly and it has helped her be able to do more at home.  Her medications are working for her and only needs her emergency inhaler on occasion. She  has a follow up appointmnet on 10/16 and they plan to set up another sleep study.    Goals/Expected Outcomes Short: Meet with doctor on 10/16 to discuss oxygen use.  Long; Continue to use PLB and manange lung disease.      Initial Exercise Prescription:     Initial Exercise Prescription - 05/24/17 1400      Date of Initial Exercise RX and Referring Provider   Date 05/24/17   Referring Provider Ancil Linsey MD     Treadmill   MPH 1.2   Grade 0   Minutes 15   METs 1.92     NuStep   Level 1   SPM 80   Minutes 15   METs 2     REL-XR   Level 1   Speed 50   Minutes 15   METs 2     Prescription Details   Frequency (times per week) 3   Duration Progress to 45 minutes of aerobic exercise without signs/symptoms of physical distress     Intensity   THRR 40-80% of Max  Heartrate 111-130   Ratings of Perceived Exertion 11-13   Perceived Dyspnea 0-4     Progression   Progression Continue to progress workloads to maintain intensity without signs/symptoms of physical distress.     Resistance Training   Training Prescription Yes   Weight 3 lbs   Reps 10-15      Perform Capillary Blood Glucose checks as needed.  Exercise Prescription Changes:     Exercise Prescription Changes    Row Name 06/01/17 1400 06/10/17 1200 06/14/17 1500 06/29/17 1400 07/12/17 1400     Response to Exercise   Blood Pressure (Admit) 116/70  - 130/72 134/66 122/70   Blood Pressure (Exercise) 154/84  - 140/70  -  -   Blood Pressure (Exit) 120/64  - 132/82 122/74 124/72   Heart Rate (Admit) 102 bpm  - 96 bpm 115 bpm 95 bpm   Heart Rate (Exercise) 114 bpm  - 114 bpm 134 bpm 117 bpm   Heart Rate (Exit) 99 bpm  - 84 bpm 101 bpm 94 bpm   Oxygen Saturation (Admit) 93 %  - 95 % 97 % 97 %   Oxygen Saturation (Exercise) 93 %  - 93 % 93 % 94 %   Oxygen Saturation (Exit) 94 %  - 93 % 92 % 94 %   Rating of Perceived Exertion (Exercise) 12  - '12 13 12   ' Perceived Dyspnea (Exercise) 2  - '1 2 2   ' Symptoms none  - none none none   Comments first full day of exercise  -  -  -  -   Duration Progress to 45 minutes of aerobic exercise without signs/symptoms of physical distress  - Continue with 45 min of aerobic exercise without signs/symptoms of physical distress. Continue with 45 min of aerobic exercise without signs/symptoms of physical distress. Continue with 45 min of aerobic exercise without signs/symptoms of physical distress.   Intensity THRR unchanged  - THRR unchanged THRR unchanged THRR unchanged     Progression   Progression Continue to progress workloads to maintain intensity without signs/symptoms of physical distress.  - Continue to progress workloads to maintain intensity without signs/symptoms of physical distress. Continue to progress workloads to maintain intensity without  signs/symptoms of physical distress. Continue to progress workloads to maintain intensity without signs/symptoms of physical distress.   Average METs 3.15  - 2.83 3.1 3.37     Resistance Training  Training Prescription Yes  - Yes Yes Yes   Weight 3 lbs  - 3 lbs 4 lbs 4 lbs  green band   Reps 10-15  - 10-15 10-15 10-15     Interval Training   Interval Training No  - No No No     Treadmill   MPH  -  - 1.3 1.7 1.7   Grade  -  - 0 0 0   Minutes  -  - '15 15 15   ' METs  -  - 12 2.3 2.3     NuStep   Level 1  - '2 3 3   ' SPM 93  - 84 89 68   Minutes 15  - '15 15 15   ' METs 2.7  - 2.7 2.9 3     REL-XR   Level 1  - '1 3 3   ' Speed 53  - 51 58 55   Minutes 15  - '15 15 15   ' METs 3.6  - 3.8 4.1 3.9     Home Exercise Plan   Plans to continue exercise at  - Home (comment)  walking, stationary bike, arthritis and balance class Home (comment)  walking, stationary bike, arthritis and balance class Home (comment)  walking, stationary bike, arthritis and balance class Home (comment)  walking, stationary bike, arthritis and balance class   Frequency  - Add 1 additional day to program exercise sessions. Add 1 additional day to program exercise sessions. Add 2 additional days to program exercise sessions. Add 2 additional days to program exercise sessions.   Initial Home Exercises Provided  - 06/10/17 06/10/17 06/10/17 06/10/17   Row Name 07/26/17 1400 08/09/17 1500 08/24/17 1500         Response to Exercise   Blood Pressure (Admit) 130/78 102/67 128/66     Blood Pressure (Exit) 106/54 126/70 124/70     Heart Rate (Admit) 104 bpm 100 bpm 93 bpm     Heart Rate (Exercise) 123 bpm 118 bpm 130 bpm     Heart Rate (Exit) 92 bpm 106 bpm 103 bpm     Oxygen Saturation (Admit) 97 % 96 % 96 %     Oxygen Saturation (Exercise) 95 % 94 % 92 %     Oxygen Saturation (Exit) 93 % 94 % 94 %     Rating of Perceived Exertion (Exercise) '12 12 12     ' Perceived Dyspnea (Exercise) '1 1 2     ' Symptoms none none none      Duration Continue with 45 min of aerobic exercise without signs/symptoms of physical distress. Continue with 45 min of aerobic exercise without signs/symptoms of physical distress. Continue with 45 min of aerobic exercise without signs/symptoms of physical distress.     Intensity THRR unchanged THRR unchanged THRR unchanged       Progression   Progression Continue to progress workloads to maintain intensity without signs/symptoms of physical distress. Continue to progress workloads to maintain intensity without signs/symptoms of physical distress. Continue to progress workloads to maintain intensity without signs/symptoms of physical distress.     Average METs 3.13 3.66 3.69       Resistance Training   Training Prescription Yes Yes Yes     Weight 4 lbs 4 lbs 4 lbs     Reps 10-15 10-15 10-15       Interval Training   Interval Training No No No       Treadmill   MPH 1.7 2 2  Grade 0 0.5 0.5     Minutes '15 15 15     ' METs 2.3 2.67 2.67       NuStep   Level '4 5 6     ' SPM 99  - 84     Minutes '15 15 15     ' METs 3 3.3 3.2       REL-XR   Level '4 7 7     ' Speed 53  - 56     Minutes '15 15 15     ' METs 4.1 5 5.2       Home Exercise Plan   Plans to continue exercise at Home (comment)  walking, stationary bike, arthritis and balance class Home (comment)  walking, stationary bike, arthritis and balance class Home (comment)  walking, stationary bike, arthritis and balance class     Frequency Add 2 additional days to program exercise sessions. Add 2 additional days to program exercise sessions. Add 2 additional days to program exercise sessions.     Initial Home Exercises Provided 06/10/17 06/10/17 06/10/17        Exercise Comments:     Exercise Comments    Row Name 06/01/17 1415 06/03/17 1227         Exercise Comments First full day of exercise!  Patient was oriented to gym and equipment including functions, settings, policies, and procedures.  Patient's individual exercise  prescription and treatment plan were reviewed.  All starting workloads were established based on the results of the 6 minute walk test done at initial orientation visit.  The plan for exercise progression was also introduced and progression will be customized based on patient's performance and goals. Claire Cortez did 4 min intervals on the treadmill today.  She will work up to the full 15 min!         Exercise Goals and Review:     Exercise Goals    Row Name 05/24/17 1407             Exercise Goals   Increase Physical Activity Yes       Intervention Provide advice, education, support and counseling about physical activity/exercise needs.;Develop an individualized exercise prescription for aerobic and resistive training based on initial evaluation findings, risk stratification, comorbidities and participant's personal goals.       Expected Outcomes Achievement of increased cardiorespiratory fitness and enhanced flexibility, muscular endurance and strength shown through measurements of functional capacity and personal statement of participant.       Increase Strength and Stamina Yes       Intervention Provide advice, education, support and counseling about physical activity/exercise needs.;Develop an individualized exercise prescription for aerobic and resistive training based on initial evaluation findings, risk stratification, comorbidities and participant's personal goals.       Expected Outcomes Achievement of increased cardiorespiratory fitness and enhanced flexibility, muscular endurance and strength shown through measurements of functional capacity and personal statement of participant.          Exercise Goals Re-Evaluation :     Exercise Goals Re-Evaluation    Row Name 06/01/17 1415 06/10/17 1245 06/14/17 1458 06/20/17 1253 06/29/17 1430     Exercise Goal Re-Evaluation   Exercise Goals Review Increase Physical Activity;Increase Strenth and Stamina Increase Physical Activity;Increase  Strenth and Stamina Increase Physical Activity;Increase Strenth and Stamina Increase Physical Activity;Increase Strenth and Stamina Increase Physical Activity;Increase Strength and Stamina   Comments Claire Cortez completed her first full day of exercise today.  She had also attended her arthristis and balance class  this morning.  We talked about how it may initially be too much for her to handle and we will see how it goes doing both classes together.  She also mentioned that she has a recumbent bike at home. We will continue to monitor her progression. Reviewed home exercise with pt today.  Pt plans to walk and use stationary bike at home for exercise.  Claire Cortez also attends an arthritis and balance class on MWF.  Reviewed THR, pulse, RPE, sign and symptoms, and when to call 911 or MD.  Also discussed weather considerations and indoor options.  Pt voiced understanding. Claire Cortez is off to a great start in rehab.  She has worked her way up to 15 continuous on the treadmill!!  She is also up to 3.8 METs on the XR.  We will continue to monitor her progression.  Claire Cortez has increased her level on the NS from 2 to 3 and her speed on the TM from 1.2-1.7 mph since starting program. She stated that she feels stronger and is able to do most of what she wants to. Claire Cortez also exercises independently at the senior center. Claire Cortez is doing well in rehab.  She is now on level 3 on the XR and maintaining her 1.7 mph for the full 15 min on the treadmill.  She is enjoying class and still able to go do her balance and arthritis class at the [facility].    Expected Outcomes Short: Come to [facility] routinely.  Long: Build up strength and stamina.  Short: Add in an extra day of home exercise in two weeks.  Long: Continue to increase physical activity.  Short: Move up on XR and add incline to treadmill.  Long: Exercise independently some at home. Short: Make increases on the XR and add elevation to TM. (added this increase for next exercise session  Long: Consistantly attend pulmonary rehab and exercise at senior center. Aim for 4-5 days a week of exercise.  Short: Continue to work on increasing TM workloads.  Long: Continue to exercise daily.    [place] Name 07/12/17 1402 07/26/17 1439 08/03/17 1229 08/09/17 1454 08/24/17 1524     Exercise Goal Re-Evaluation   Exercise Goals Review Increase Physical Activity;Increase Strength and Stamina Increase Physical Activity;Increase Strength and Stamina Increase Physical Activity;Increase Strength and Stamina;Understanding of Exercise Prescription Increase Physical Activity;Increase Strength and Stamina Increase Physical Activity;Increase Strength and Stamina   Comments Claire Cortez continues to do well in rehab. She is up to 3 METs on the NuStep.  We will continue to monitor her progression.  She has missed her balance class for the last week as the instructor was out of town. Claire Cortez has been doing well in rehab.   She is now on level 4 on the NuStep and XR.  We will continue to monitor her progress.  Claire Cortez continues to do well in rehab.  She is going to her balance class on Mondays only as it gives her two days to rest.  She is walking her dog on the other days of the week. She is doing more at home.   Claire Cortez feels that her strength and stamina is getting better.  Her kids have begun to notice a difference!  She thinks she is losing inches but not weight.  Claire Cortez has been doing well in rehab.  She is now up to level 5 on the NuStep and level 7 on the XR.   We will continue to monitor her progression.  Claire Cortez continues to do  well in rehab.  She will be graduating on Monday!!  She has come a long ways.  She started at level 1 on everything ans is now on levels 6 and 7.   Expected Outcomes Short: Increase workloads on XR and NuStep.  Long: Make exercise part of daily habit. Short: Continue to increase METs.  Long: Continue to exercise regularly Short: Continue to exercise on days off and work on increasing METs on seated  equipment.  Long: Continue to exercise regularly.  Short: Try to add more incline on treadmill.  Long: Continue to work on increasing her strength and stamina.  Short: Graduation!  Long: Continue to exercise independently.      Discharge Exercise Prescription (Final Exercise Prescription Changes):     Exercise Prescription Changes - 08/24/17 1500      Response to Exercise   Blood Pressure (Admit) 128/66   Blood Pressure (Exit) 124/70   Heart Rate (Admit) 93 bpm   Heart Rate (Exercise) 130 bpm   Heart Rate (Exit) 103 bpm   Oxygen Saturation (Admit) 96 %   Oxygen Saturation (Exercise) 92 %   Oxygen Saturation (Exit) 94 %   Rating of Perceived Exertion (Exercise) 12   Perceived Dyspnea (Exercise) 2   Symptoms none   Duration Continue with 45 min of aerobic exercise without signs/symptoms of physical distress.   Intensity THRR unchanged     Progression   Progression Continue to progress workloads to maintain intensity without signs/symptoms of physical distress.   Average METs 3.69     Resistance Training   Training Prescription Yes   Weight 4 lbs   Reps 10-15     Interval Training   Interval Training No     Treadmill   MPH 2   Grade 0.5   Minutes 15   METs 2.67     NuStep   Level 6   SPM 84   Minutes 15   METs 3.2     REL-XR   Level 7   Speed 56   Minutes 15   METs 5.2     Home Exercise Plan   Plans to continue exercise at Home (comment)  walking, stationary bike, arthritis and balance class   Frequency Add 2 additional days to program exercise sessions.   Initial Home Exercises Provided 06/10/17      Nutrition:  Target Goals: Understanding of nutrition guidelines, daily intake of sodium <1562m, cholesterol <2088m calories 30% from fat and 7% or less from saturated fats, daily to have 5 or more servings of fruits and vegetables.  Biometrics:     Pre Biometrics - 05/24/17 1407      Pre Biometrics   Height 5' 4.8" (1.646 m)   Weight 217 lb 1.6 oz  (98.5 kg)   Waist Circumference 40 inches   Hip Circumference 50.5 inches   Waist to Hip Ratio 0.79 %   BMI (Calculated) 36.4         Post Biometrics - 08/19/17 1209       Post  Biometrics   Height 5' 4.8" (1.646 m)   Weight 217 lb (98.4 kg)   Waist Circumference 42 inches   Hip Circumference 52 inches   Waist to Hip Ratio 0.81 %   BMI (Calculated) 36.33      Nutrition Therapy Plan and Nutrition Goals:     Nutrition Therapy & Goals - 08/03/17 1235      Nutrition Therapy   RD appointment defered Yes  Nutrition Discharge: Rate Your Plate Scores:   Nutrition Goals Re-Evaluation:     Nutrition Goals Re-Evaluation    [place] Name 06/20/17 1304 07/08/17 1233 08/03/17 1235         Goals   Current Weight 218 lb (98.9 kg)  - 217 lb (98.4 kg)     Nutrition Goal  -  - Lose weight     Comment Patient does not want to see dietician at this time, but reports that she does read food labels and tries to watch protion sizes. She aims to eat 6 small meals a day.  Claire Cortez does not wish to meet with the dietician. She reports knowing what to eat, just struggles with preparing food for one. She is reading labels and trying new recipes.  Declines nuttion appointment.  Claire Cortez continues to eat alone and eats two meals a day and has one snack a day.  She knows what to eat, but struggles to prep for one. We talked about prepping for whole week.  She is also going to start using My Fitness Pal to help track.  She is going to aim for better snacking.      Expected Outcome  -  - Short: Start using My Fitness Pal.  Long: Continue to work on diet to lose weight.         Nutrition Goals Discharge (Final Nutrition Goals Re-Evaluation):     Nutrition Goals Re-Evaluation - 08/03/17 1235      Goals   Current Weight 217 lb (98.4 kg)   Nutrition Goal Lose weight   Comment Declines nuttion appointment.  Claire Cortez continues to eat alone and eats two meals a day and has one snack a day.  She knows what to  eat, but struggles to prep for one. We talked about prepping for whole week.  She is also going to start using My Fitness Pal to help track.  She is going to aim for better snacking.    Expected Outcome Short: Start using My Fitness Pal.  Long: Continue to work on diet to lose weight.       Psychosocial: Target Goals: Acknowledge presence or absence of significant depression and/or stress, maximize coping skills, provide positive support system. Participant is able to verbalize types and ability to use techniques and skills needed for reducing stress and depression.   Initial Review & Psychosocial Screening:     Initial Psych Review & Screening - 05/24/17 1334      Initial Review   Current issues with None Identified     Family Dynamics   Good Support System? Yes     Barriers   Psychosocial barriers to participate in program There are no identifiable barriers or psychosocial needs.     Screening Interventions   Interventions Encouraged to exercise      Quality of Life Scores:     Quality of Life - 08/17/17 1152      Quality of Life Scores   Health/Function Pre 20.79 %   Health/Function Post 27 %   Health/Function % Change 29.87 %   Socioeconomic Pre 23.33 %   Socioeconomic Post 28 %   Socioeconomic % Change  20.02 %   Psych/Spiritual Pre 23.57 %   Psych/Spiritual Post 29.14 %   Psych/Spiritual % Change 23.63 %   Family Pre 27.5 %   Family Post 30 %   Family % Change 9.09 %   GLOBAL Pre 22.62 %   GLOBAL Post 28.14 %   GLOBAL %  Change 24.4 %      PHQ-9: Recent Review Flowsheet Data    Depression screen [facility] 2/9 08/17/2017 08/03/2017 05/24/2017   Decreased Interest 0 0 0   Down, Depressed, Hopeless 0 0 0   PHQ - 2 Score 0 0 0   Altered sleeping '2 1 2   ' Tired, decreased energy '2 3  2   ' Change in appetite 1 0 1   Feeling bad or failure about yourself  0 0 0   Trouble concentrating 0 0 0   Moving slowly or fidgety/restless 0 0 0   Suicidal thoughts 0 0 0   PHQ-9  Score '5 4 5   ' Difficult doing work/chores Not difficult at all Not difficult at all Not difficult at all     Interpretation of Total Score  Total Score Depression Severity:  1-4 = Minimal depression, 5-9 = Mild depression, 10-14 = Moderate depression, 15-19 = Moderately severe depression, 20-27 = Severe depression   Psychosocial Evaluation and Intervention:     Psychosocial Evaluation - 06/08/17 1244      Psychosocial Evaluation & Interventions   Interventions Encouraged to exercise with the program and follow exercise prescription;Relaxation education;Stress management education   Comments Counselor met with Claire Cortez Claire Cortez) today for initial psychosocial evaluation.  She is an 83 year old who struggles with COPD.   Claire Cortez lives alone but has a daughter and her family who live locally and are in contact with her often.  Claire Cortez has had open heart surgery in the past and a knee replacement 13 months ago that she reports "still hurts."  She reports sleeping "not great" which has been going on for quite some time with difficulty getting to sleep initially - but getting approximately 6 hours when she does.  She denies a history of depression or anxiety or any current symptoms and states she is typically in a positive mood with minimal stress in her life.  Claire Cortez has goals to breathe better and lose some weight while in this program.  Staff will follow through the course of the program with Claire Cortez.   Expected Outcomes Claire Cortez will benefit from consistent exercise to achieve her stated goals.  Meeting with the dietician to address the weight loss goals will be helpful.  Staff will follow   Continue Psychosocial Services  Follow up required by staff      Psychosocial Re-Evaluation:     Psychosocial Re-Evaluation    [place] Name 06/20/17 1306 07/06/17 1244 07/08/17 1236 08/03/17 1247       Psychosocial Re-Evaluation   Current issues with Current Sleep Concerns  - None Identified Current Stress Concerns     Comments Patient reports she is still under little stress and has a positive attitude most of the time. Her only concern is that she has a hard time getting to sleep and only sleeps about 6 hours a night (no change since last assessment) Claire Cortez reports enjoying this class and her family and she are noticing positive benefits as she is experiencing less shortness of breath and able to do more.  She continues to sleep about 6 hours a night and reports this is enough currently and she manages her stress well with exercise and a positive attitude.  Counselor commended [employer] for her progress made in such a short time.   Claire Cortez is very encouraged at the progress she is making. She thoroughly enjoys coming to class and everything she is learning. She enjoys walking her dog, spending time with  family, and attending classes at the [facility].  Claire Cortez continues to enjoy coming to class.  She is even going out to lunch after class with several of her classmates.  She continues to walk her dog and go to class for exercise.    She is stressed by her lack of weight loss.  She is also dealing with getting a new air conditioner at home.  She remains postivie and  has a good attitude overall.  She continues to sleep well from 2am -8am.      Expected Outcomes Short: wants to try listening to soft music before bed to help her fall asleep. Long: establish better evening habits to help her fall asleep more consistantly  Continue to exercise and work on sleep strategies.   Short: continue to exercise at [place]; Long: continue the positive lifestyle changes she is learning and apply to life at home Short: Continue to attend class and meet with friends for support.  Resolve air conditioner unit at home.  Long: Maintain postive outlook on life.     Interventions  -  - Encouraged to attend Pulmonary Rehabilitation for the exercise Stress management education;Encouraged to attend Pulmonary Rehabilitation for the exercise;Relaxation  education    Continue Psychosocial Services  Follow up required by staff  -  - Follow up required by staff      Initial Review   Source of Stress Concerns  -  -  - Chronic Illness       Psychosocial Discharge (Final Psychosocial Re-Evaluation):     Psychosocial Re-Evaluation - 08/03/17 1247      Psychosocial Re-Evaluation   Current issues with Current Stress Concerns   Comments Claire Cortez continues to enjoy coming to class.  She is even going out to lunch after class with several of her classmates.  She continues to walk her dog and go to class for exercise.    She is stressed by her lack of weight loss.  She is also dealing with getting a new air conditioner at home.  She remains postivie and  has a good attitude overall.  She continues to sleep well from 2am -8am.     Expected Outcomes Short: Continue to attend class and meet with friends for support.  Resolve air conditioner unit at home.  Long: Maintain postive outlook on life.    Interventions Stress management education;Encouraged to attend Pulmonary Rehabilitation for the exercise;Relaxation education   Continue Psychosocial Services  Follow up required by staff     Initial Review   Source of Stress Concerns Chronic Illness      Education: Education Goals: Education classes will be provided on a weekly basis, covering required topics. Participant will state understanding/return demonstration of topics presented.  Learning Barriers/Preferences:     Learning Barriers/Preferences - 05/24/17 1328      Learning Barriers/Preferences   Learning Barriers None   Learning Preferences None      Education Topics: Initial Evaluation Education: - Verbal, written and demonstration of respiratory meds, RPE/PD scales, oximetry and breathing techniques. Instruction on use of nebulizers and MDIs: cleaning and proper use, rinsing mouth with steroid doses and importance of monitoring MDI activations.   Pulmonary Rehab from 08/22/2017 in [facility]  Cardiac and Pulmonary Rehab  Date  05/24/17  Educator  [facility]  Instruction Review Code (retired)  2- meets goals/outcomes  Instruction Review Code  1- IT trainer Nutrition Guidelines/Fats and Fiber: -Group instruction provided by verbal, written material, models and  posters to present the general guidelines for heart healthy nutrition. Gives an explanation and review of dietary fats and fiber.   Pulmonary Rehab from 08/22/2017 in [facility] Cardiac and Pulmonary Rehab  Date  08/15/17  Educator  CR  Instruction Review Code  1- Verbalizes Understanding      Controlling Sodium/Reading Food Labels: -Group verbal and written material supporting the discussion of sodium use in heart healthy nutrition. Review and explanation with models, verbal and written materials for utilization of the food label.   Pulmonary Rehab from 08/22/2017 in [facility] Cardiac and Pulmonary Rehab  Date  08/22/17  Educator  CR  Instruction Review Code  1- Verbalizes Understanding      Exercise Physiology & Risk Factors: - Group verbal and written instruction with models to review the exercise physiology of the cardiovascular system and associated critical values. Details cardiovascular disease risk factors and the goals associated with each risk factor.   Pulmonary Rehab from 08/22/2017 in [facility] Cardiac and Pulmonary Rehab  Date  08/05/17  Educator  [facility]  Instruction Review Code  1- Verbalizes Understanding      Aerobic Exercise & Resistance Training: - Gives group verbal and written discussion on the health impact of inactivity. On the components of aerobic and resistive training programs and the benefits of this training and how to safely progress through these programs.   Flexibility, Balance, General Exercise Guidelines: - Provides group verbal and written instruction on the benefits of flexibility and balance training programs. Provides general exercise guidelines with specific guidelines to  those with heart or lung disease. Demonstration and skill practice provided.   Pulmonary Rehab from 08/22/2017 in [facility] Cardiac and Pulmonary Rehab  Date  06/15/17  Educator  AS  Instruction Review Code  1- Verbalizes Understanding      Stress Management: - Provides group verbal and written instruction about the health risks of elevated stress, cause of high stress, and healthy ways to reduce stress.   Pulmonary Rehab from 08/22/2017 in [facility] Cardiac and Pulmonary Rehab  Date  07/20/17  Educator  [facility]  Instruction Review Code  1- Verbalizes Understanding      Depression: - Provides group verbal and written instruction on the correlation between heart/lung disease and depressed mood, treatment options, and the stigmas associated with seeking treatment.   Pulmonary Rehab from 08/22/2017 in [facility] Cardiac and Pulmonary Rehab  Date  06/22/17  Educator  [facility]  Instruction Review Code (retired)  2- meets goals/outcomes  Instruction Review Code  1- Science writer Understanding      Exercise & Equipment Safety: - Individual verbal instruction and demonstration of equipment use and safety with use of the equipment.   Infection Prevention: - Provides verbal and written material to individual with discussion of infection control including proper hand washing and proper equipment cleaning during exercise session.   Pulmonary Rehab from 08/22/2017 in [facility] Cardiac and Pulmonary Rehab  Date  05/24/17  Educator  [facility]  Instruction Review Code  1- Verbalizes Understanding      Falls Prevention: - Provides verbal and written material to individual with discussion of falls prevention and safety.   Pulmonary Rehab from 08/22/2017 in [facility] Cardiac and Pulmonary Rehab  Date  05/24/17  Educator  [facility]  Instruction Review Code (retired)  2- meets goals/outcomes  Instruction Review Code  1- Science writer Understanding      Diabetes: - Individual verbal and written instruction to review signs/symptoms of diabetes,  desired ranges of glucose level fasting, after meals and with exercise.  Advice that pre and post exercise glucose checks will be done for 3 sessions at entry of program.   Chronic Lung Diseases: - Group verbal and written instruction to review new updates, new respiratory medications, new advancements in procedures and treatments. Provide informative websites and "800" numbers of self-education.   Pulmonary Rehab from 08/22/2017 in [facility] Cardiac and Pulmonary Rehab  Date  08/03/17  Educator  [facility]  Instruction Review Code  1- Verbalizes Understanding      Lung Procedures: - Group verbal and written instruction to describe testing methods done to diagnose lung disease. Review the outcome of test results. Describe the treatment choices: Pulmonary Function Tests, ABGs and oximetry.   Energy Conservation: - Provide group verbal and written instruction for methods to conserve energy, plan and organize activities. Instruct on pacing techniques, use of adaptive equipment and posture/positioning to relieve shortness of breath.   Triggers: - Group verbal and written instruction to review types of environmental controls: home humidity, furnaces, filters, dust mite/pet prevention, HEPA vacuums. To discuss weather changes, air quality and the benefits of nasal washing.   Exacerbations: - Group verbal and written instruction to provide: warning signs, infection symptoms, calling MD promptly, preventive modes, and value of vaccinations. Review: effective airway clearance, coughing and/or vibration techniques. Create an Sports administrator.   Pulmonary Rehab from 08/22/2017 in [facility] Cardiac and Pulmonary Rehab  Date  06/01/17  Educator  [facility]  Instruction Review Code  1- Verbalizes Understanding      Oxygen: - Individual and group verbal and written instruction on oxygen therapy. Includes supplement oxygen, available portable oxygen systems, continuous and intermittent flow rates, oxygen safety, concentrators,  and Medicare reimbursement for oxygen.   Respiratory Medications: - Group verbal and written instruction to review medications for lung disease. Drug class, frequency, complications, importance of spacers, rinsing mouth after steroid MDI's, and proper cleaning methods for nebulizers.   Pulmonary Rehab from 08/22/2017 in [facility] Cardiac and Pulmonary Rehab  Date  05/24/17  Educator  [facility]  Instruction Review Code (retired)  2- meets goals/outcomes  Instruction Review Code  1- Science writer Understanding      AED/CPR: - Group verbal and written instruction with the use of models to demonstrate the basic use of the AED with the basic ABC's of resuscitation.   Pulmonary Rehab from 08/22/2017 in [facility] Cardiac and Pulmonary Rehab  Date  07/22/17  Educator  CE  Instruction Review Code  1- Verbalizes Understanding      Breathing Retraining: - Provides individuals verbal and written instruction on purpose, frequency, and proper technique of diaphragmatic breathing and pursed-lipped breathing. Applies individual practice skills.   Pulmonary Rehab from 08/22/2017 in [facility] Cardiac and Pulmonary Rehab  Date  06/01/17  Educator  [facility]  Instruction Review Code  1- Verbalizes Understanding      Anatomy and Physiology of the Lungs: - Group verbal and written instruction with the use of models to provide basic lung anatomy and physiology related to function, structure and complications of lung disease.   Pulmonary Rehab from 08/22/2017 in [facility] Cardiac and Pulmonary Rehab  Date  07/06/17  Educator  [facility]  Instruction Review Code  1- Verbalizes Understanding      Anatomy & Physiology of the Heart: - Group verbal and written instruction and models provide basic cardiac anatomy and physiology, with the coronary electrical and arterial systems. Review of: AMI, Angina, Valve disease, Heart Failure, Cardiac Arrhythmia, Pacemakers, and the ICD.   Pulmonary Rehab from 08/22/2017 in [facility] Cardiac and Pulmonary  Rehab   Date  06/10/17  Educator  [facility]  Instruction Review Code  1- Verbalizes Understanding      Heart Failure: - Group verbal and written instruction on the basics of heart failure: signs/symptoms, treatments, explanation of ejection fraction, enlarged heart and cardiomyopathy.   Pulmonary Rehab from 08/22/2017 in [facility] Cardiac and Pulmonary Rehab  Date  06/10/17  Educator  [facility]  Instruction Review Code  1- Verbalizes Understanding      Sleep Apnea: - Individual verbal and written instruction to review Obstructive Sleep Apnea. Review of risk factors, methods for diagnosing and types of masks and machines for OSA.   Anxiety: - Provides group, verbal and written instruction on the correlation between heart/lung disease and anxiety, treatment options, and management of anxiety.   Pulmonary Rehab from 08/22/2017 in [facility] Cardiac and Pulmonary Rehab  Date  07/20/17  Educator  [facility]  Instruction Review Code  1- Verbalizes Understanding      Relaxation: - Provides group, verbal and written instruction about the benefits of relaxation for patients with heart/lung disease. Also provides patients with examples of relaxation techniques.   Pulmonary Rehab from 08/22/2017 in [facility] Cardiac and Pulmonary Rehab  Date  08/17/17  Educator  [facility]  Instruction Review Code  1- Verbalizes Understanding      Cardiac Medications: - Group verbal and written instruction to review commonly prescribed medications for heart disease. Reviews the medication, class of the drug, and side effects.   Pulmonary Rehab from 08/22/2017 in [facility] Cardiac and Pulmonary Rehab  Date  07/08/17  Educator  CE  Instruction Review Code  1- Verbalizes Understanding      Know Your Numbers: -Group verbal and written instruction about important numbers in your health.  Review of Cholesterol, Blood Pressure, Diabetes, and BMI and the role they play in your overall health.   Pulmonary Rehab from 08/22/2017 in [facility] Cardiac and Pulmonary  Rehab  Date  08/12/17  Educator  [facility]  Instruction Review Code  1- Verbalizes Understanding      Other: -Provides group and verbal instruction on various topics (see comments)    Knowledge Questionnaire Score:     Knowledge Questionnaire Score - 08/17/17 1151      Knowledge Questionnaire Score   Pre Score 7/10   Post Score 8/10  Reviewed with patient       Core Components/Risk Factors/Patient Goals at Admission:     Personal Goals and Risk Factors at Admission - 05/24/17 1320      Core Components/Risk Factors/Patient Goals on Admission    Weight Management Yes;Obesity   Intervention Weight Management: Develop a combined nutrition and exercise program designed to reach desired caloric intake, while maintaining appropriate intake of nutrient and fiber, sodium and fats, and appropriate energy expenditure required for the weight goal.;Weight Management: Provide education and appropriate resources to help participant work on and attain dietary goals.;Weight Management/Obesity: Establish reasonable short term and long term weight goals.;Obesity: Provide education and appropriate resources to help participant work on and attain dietary goals.   Admit Weight 217 lb 1.6 oz (98.5 kg)   Goal Weight: Short Term 212 lb (96.2 kg)   Goal Weight: Long Term 180 lb (81.6 kg)   Expected Outcomes Short Term: Continue to assess and modify interventions until short term weight is achieved;Long Term: Adherence to nutrition and physical activity/exercise program aimed toward attainment of established weight goal;Weight Maintenance: Understanding of the daily nutrition guidelines, which includes 25-35% calories from fat, 7% or less cal from saturated  fats, less than 229m cholesterol, less than 1.5gm of sodium, & 5 or more servings of fruits and vegetables daily;Weight Loss: Understanding of general recommendations for a balanced deficit meal plan, which promotes 1-2 lb weight loss per week and includes a  negative energy balance of [phone] kcal/d;Understanding recommendations for meals to include 15-35% energy as protein, 25-35% energy from fat, 35-60% energy from carbohydrates, less than 2013mof dietary cholesterol, 20-35 gm of total fiber daily;Understanding of distribution of calorie intake throughout the day with the consumption of 4-5 meals/snacks   Tobacco Cessation --  patient does not smoke    Improve shortness of breath with ADL's Yes   Intervention Provide education, individualized exercise plan and daily activity instruction to help decrease symptoms of SOB with activities of daily living.   Expected Outcomes Short Term: Achieves a reduction of symptoms when performing activities of daily living.   Develop more efficient breathing techniques such as purse lipped breathing and diaphragmatic breathing; and practicing self-pacing with activity Yes   Intervention Provide education, demonstration and support about specific breathing techniuqes utilized for more efficient breathing. Include techniques such as pursed lipped breathing, diaphragmatic breathing and self-pacing activity.   Expected Outcomes Short Term: Participant will be able to demonstrate and use breathing techniques as needed throughout daily activities.   Increase knowledge of respiratory medications and ability to use respiratory devices properly  Yes   Intervention Provide education and demonstration as needed of appropriate use of medications, inhalers, and oxygen therapy.   Expected Outcomes Short Term: Achieves understanding of medications use. Understands that oxygen is a medication prescribed by physician. Demonstrates appropriate use of inhaler and oxygen therapy.   Diabetes Yes  Pre diabetic   Intervention Provide education about signs/symptoms and action to take for hypo/hyperglycemia.;Provide education about proper nutrition, including hydration, and aerobic/resistive exercise prescription along with prescribed  medications to achieve blood glucose in normal ranges: Fasting glucose 65-99 mg/dL   Expected Outcomes Short Term: Participant verbalizes understanding of the signs/symptoms and immediate care of hyper/hypoglycemia, proper foot care and importance of medication, aerobic/resistive exercise and nutrition plan for blood glucose control.;Long Term: Attainment of HbA1C < 7%.   Heart Failure Yes   Intervention Provide a combined exercise and nutrition program that is supplemented with education, support and counseling about heart failure. Directed toward relieving symptoms such as shortness of breath, decreased exercise tolerance, and extremity edema.   Expected Outcomes Improve functional capacity of life;Short term: Attendance in program 2-3 days a week with increased exercise capacity. Reported lower sodium intake. Reported increased fruit and vegetable intake. Reports medication compliance.;Short term: Daily weights obtained and reported for increase. Utilizing diuretic protocols set by physician.;Long term: Adoption of self-care skills and reduction of barriers for early signs and symptoms recognition and intervention leading to self-care maintenance.   Lipids Yes   Intervention Provide education and support for participant on nutrition & aerobic/resistive exercise along with prescribed medications to achieve LDL <7043mHDL >24m32m Expected Outcomes Short Term: Participant states understanding of desired cholesterol values and is compliant with medications prescribed. Participant is following exercise prescription and nutrition guidelines.;Long Term: Cholesterol controlled with medications as prescribed, with individualized exercise RX and with personalized nutrition plan. Value goals: LDL < 70mg71mL > 40 mg.      Core Components/Risk Factors/Patient Goals Review:      Goals and Risk Factor Review    Row Name 06/20/17 1300 07/08/17 1222 08/03/17 1231         Core  Components/Risk Factors/Patient  Goals Review   Personal Goals Review Weight Management/Obesity;Diabetes;Hypertension;Lipids Weight Management/Obesity;Lipids;Hypertension;Improve shortness of breath with ADL's Weight Management/Obesity;Lipids;Hypertension;Improve shortness of breath with ADL's     Review Patient wants to continue to lose weight and is considered pre-diabetic. She recently had lab work done and the doctor told her the results were good.  Claire Cortez reports she has lost a couple pounds but wishes to keep losing weight. She revealed struggles to cook for one at home but is looking into different nutrition options. She did not want to meet with the nutritionist just yet, but knows that it is an option. She has noticed her decrease in blood pressure and her doctor is happy about her lab values so far. Her ability to do things at home and out has increased since starting class and she really is excited to see what else she can start doing with her improvement.  Claire Cortez's weight has been fairly steady.  She feels that she is losing inches but not losing weight.  She continues to eat by herself at home.  Still declining to meet with nutrtionist.  Blood pressures have been going and continues to check at home.  She is doing more at home with less SOB.  Medications seems to working for her and doing well.      Expected Outcomes Short: 1-2 lbs weight loss per week. Long: continue exercise programs and healthy dietary choices to control BP, blood sugars, and lipid panels.  Short: lose 1-3 pounds in the next couple weeks by eating healthier, continue improvement with SOB by attending [facility] and exercising at home on her bike and walking her dog ; Long: lose 30 pounds, continue healthier lifestyle choices after [facility] Short: Continue to work on weight loss at 1-2 lbs a week with watching her diet.  Long: Continue to work on risk factor modification.         Core Components/Risk Factors/Patient Goals at Discharge (Final Review):       Goals and Risk Factor Review - 08/03/17 1231      Core Components/Risk Factors/Patient Goals Review   Personal Goals Review Weight Management/Obesity;Lipids;Hypertension;Improve shortness of breath with ADL's   Review Claire Cortez's weight has been fairly steady.  She feels that she is losing inches but not losing weight.  She continues to eat by herself at home.  Still declining to meet with nutrtionist.  Blood pressures have been going and continues to check at home.  She is doing more at home with less SOB.  Medications seems to working for her and doing well.    Expected Outcomes Short: Continue to work on weight loss at 1-2 lbs a week with watching her diet.  Long: Continue to work on risk factor modification.       ITP Comments:     ITP Comments    Row Name 05/24/17 1315 05/30/17 0836 06/27/17 0843 07/25/17 0841 08/22/17 0837   ITP Comments Medical evaluation completed. Visit diagnosis can be found in [facility] encounter 05/24/17, Initial ITP sent to Dr. Emily Filbert director of [place] for review and changes. 30 day review completed ITP sent to Dr. Ramonita Lab for Dr. Emily Filbert Director of [place]. Continue with ITP unless changes are made by physician. New to program has not started sessions. 30 day review completed. ITP sent to Dr. Emily Filbert Director of [place]. Continue with ITP unless changes are made by physician.   30 day review completed. ITP sent to Dr. Emily Filbert Director of [place]. Continue  with ITP unless changes are made by physician.   30 day review completed. ITP sent to Dr. Emily Filbert Director of [place]. Continue with ITP unless changes are made by physician.        Comments: Discharge ITP

## 2017-08-29 NOTE — Progress Notes (Signed)
Discharge Progress Report  Patient Details  Name: Claire Cortez MRN: [id number] Date of Birth: 01/27/1934 Referring Provider:     Pulmonary Rehab from 05/24/2017 in [facility]  Referring Provider  Ancil Linsey MD       Number of Visits: 36/36  Reason for Discharge:  Patient reached a stable level of exercise. Patient independent in their exercise. Patient has met program and personal goals.  Smoking History:  History  Smoking Status  . Former Smoker  . Packs/day: 1.50  . Years: 15.00  . Types: Cigarettes  . Quit date: 08/25/1974  Smokeless Tobacco  . Never Used    Comment: patient has not smoked since 1975    Diagnosis:  Chronic obstructive pulmonary disease, unspecified COPD type ([place])  ADL [facility]:     Pulmonary Assessment Scores    Row Name 05/24/17 1334 07/01/17 1254 08/17/17 1150     ADL [facility]   ADL Phase Entry Mid Exit   SOB Score total 32 41 18   Rest 1 1 0   Walk 2 2 0   Stairs _0 Bath 0 1 0   Dress 0 1 0   Shop _1 mMRC Score   mMRC Score 1  -  -   Row Name 08/19/17 1212         mMRC Score   mMRC Score 0        Initial Exercise Prescription:     Initial Exercise Prescription - 05/24/17 1400      Date of Initial Exercise RX and Referring Provider   Date 05/24/17   Referring Provider Ancil Linsey MD     Treadmill   MPH 1.2   Grade 0   Minutes 15   METs 1.92     NuStep   Level 1   SPM 80   Minutes 15   METs 2     REL-XR   Level 1   Speed 50   Minutes 15   METs 2     Prescription Details   Frequency (times per week) 3   Duration Progress to 45 minutes of aerobic exercise without signs/symptoms of physical distress     Intensity   THRR 40-80% of Max Heartrate 111-130   Ratings of Perceived Exertion 11-13   Perceived Dyspnea 0-4     Progression   Progression Continue to progress workloads to maintain intensity without signs/symptoms of physical distress.     Resistance  Training   Training Prescription Yes   Weight 3 lbs   Reps 10-15      Discharge Exercise Prescription (Final Exercise Prescription Changes):     Exercise Prescription Changes - 08/24/17 1500      Response to Exercise   Blood Pressure (Admit) 128/66   Blood Pressure (Exit) 124/70   Heart Rate (Admit) 93 bpm   Heart Rate (Exercise) 130 bpm   Heart Rate (Exit) 103 bpm   Oxygen Saturation (Admit) 96 %   Oxygen Saturation (Exercise) 92 %   Oxygen Saturation (Exit) 94 %   Rating of Perceived Exertion (Exercise) 12   Perceived Dyspnea (Exercise) 2   Symptoms none   Duration Continue with 45 min of aerobic exercise without signs/symptoms of physical distress.   Intensity THRR unchanged     Progression   Progression Continue to progress workloads to maintain intensity without signs/symptoms of physical distress.   Average METs 3.69     Resistance  Training   Training Prescription Yes   Weight 4 lbs   Reps 10-15     Interval Training   Interval Training No     Treadmill   MPH 2   Grade 0.5   Minutes 15   METs 2.67     NuStep   Level 6   SPM 84   Minutes 15   METs 3.2     REL-XR   Level 7   Speed 56   Minutes 15   METs 5.2     Home Exercise Plan   Plans to continue exercise at Home (comment)  walking, stationary bike, arthritis and balance class   Frequency Add 2 additional days to program exercise sessions.   Initial Home Exercises Provided 06/10/17      Functional Capacity:     6 Minute Walk    Row Name 05/24/17 1343 08/19/17 1209       6 Minute Walk   Phase Initial Discharge    Distance 970 feet 1106 feet    Distance % Change  - 14 %    Distance Feet Change  - 136 ft    Walk Time 4.98 minutes 5.5 minutes    # of Rest Breaks 2 1    MPH 2.21 2.28    METS 7.92 2.76    RPE 13 12    Perceived Dyspnea  2 1    VO2 Peak 6.72 8.01    Symptoms Yes (comment) Yes (comment)    Comments leg fatigue, compensating for Knee, SOB hip/leg pain 4/10    Resting  HR 93 bpm 102 bpm    Resting BP 134/56 106/58    Resting Oxygen Saturation   - 96 %    Exercise Oxygen Saturation  during 6 min walk  - 90 %    Max Ex. HR 140 bpm 141 bpm    Max Ex. BP 162/70 174/70    2 Minute Post BP 154/72  -      Interval HR   Baseline HR (retired) 93  -    1 Minute HR 117  -    2 Minute HR 130 125    3 Minute HR 140 133    4 Minute HR 138 141    5 Minute HR 134 139    6 Minute HR 130 141    2 Minute Post HR 102 106    Interval Heart Rate? Yes  -      Interval Oxygen   Interval Oxygen? Yes  -    Baseline Oxygen Saturation % 92 % 96 %    Resting Liters of Oxygen 0 L  Room Air  -    1 Minute Oxygen Saturation % 92 %  -    1 Minute Liters of Oxygen 0 L 0 L    2 Minute Oxygen Saturation % 91 % 93 %    2 Minute Liters of Oxygen 0 L 0 L    3 Minute Oxygen Saturation % 90 % 94 %    3 Minute Liters of Oxygen 0 L 0 L    4 Minute Oxygen Saturation % 90 % 94 %    4 Minute Liters of Oxygen 0 L 0 L    5 Minute Oxygen Saturation % 93 % 90 %    5 Minute Liters of Oxygen 0 L 0 L    6 Minute Oxygen Saturation % 92 % 93 %    6 Minute Liters of Oxygen  0 L 0 L    2 Minute Post Oxygen Saturation % 94 % 98 %    2 Minute Post Liters of Oxygen 0 L 0 L       Psychological, QOL, Others - Outcomes: PHQ 2/9: Depression screen [facility] 2/9 08/17/2017 08/03/2017 05/24/2017  Decreased Interest 0 0 0  Down, Depressed, Hopeless 0 0 0  PHQ - 2 Score 0 0 0  Altered sleeping _0 Tired, decreased energy _1 Change in appetite 1 0 1  Feeling bad or failure about yourself  0 0 0  Trouble concentrating 0 0 0  Moving slowly or fidgety/restless 0 0 0  Suicidal thoughts 0 0 0  PHQ-9 Score _2 Difficult doing work/chores Not difficult at all Not difficult at all Not difficult at all    Quality of Life:     Quality of Life - 08/17/17 1152      Quality of Life Scores   Health/Function Pre 20.79 %   Health/Function Post 27 %   Health/Function % Change 29.87 %    Socioeconomic Pre 23.33 %   Socioeconomic Post 28 %   Socioeconomic % Change  20.02 %   Psych/Spiritual Pre 23.57 %   Psych/Spiritual Post 29.14 %   Psych/Spiritual % Change 23.63 %   Family Pre 27.5 %   Family Post 30 %   Family % Change 9.09 %   GLOBAL Pre 22.62 %   GLOBAL Post 28.14 %   GLOBAL % Change 24.4 %      Personal Goals: Goals established at orientation with interventions provided to work toward goal.     Personal Goals and Risk Factors at Admission - 05/24/17 1320      Core Components/Risk Factors/Patient Goals on Admission    Weight Management Yes;Obesity   Intervention Weight Management: Develop a combined nutrition and exercise program designed to reach desired caloric intake, while maintaining appropriate intake of nutrient and fiber, sodium and fats, and appropriate energy expenditure required for the weight goal.;Weight Management: Provide education and appropriate resources to help participant work on and attain dietary goals.;Weight Management/Obesity: Establish reasonable short term and long term weight goals.;Obesity: Provide education and appropriate resources to help participant work on and attain dietary goals.   Admit Weight 217 lb 1.6 oz (98.5 kg)   Goal Weight: Short Term 212 lb (96.2 kg)   Goal Weight: Long Term 180 lb (81.6 kg)   Expected Outcomes Short Term: Continue to assess and modify interventions until short term weight is achieved;Long Term: Adherence to nutrition and physical activity/exercise program aimed toward attainment of established weight goal;Weight Maintenance: Understanding of the daily nutrition guidelines, which includes 25-35% calories from fat, 7% or less cal from saturated fats, less than 274m cholesterol, less than 1.5gm of sodium, & 5 or more servings of fruits and vegetables daily;Weight Loss: Understanding of general recommendations for a balanced deficit meal plan, which promotes 1-2 lb weight loss per week and includes a  negative energy balance of [phone] kcal/d;Understanding recommendations for meals to include 15-35% energy as protein, 25-35% energy from fat, 35-60% energy from carbohydrates, less than 2019mof dietary cholesterol, 20-35 gm of total fiber daily;Understanding of distribution of calorie intake throughout the day with the consumption of 4-5 meals/snacks   Tobacco Cessation --  patient does not smoke    Improve shortness of breath with ADL's Yes   Intervention Provide education, individualized exercise plan and daily activity instruction to help  decrease symptoms of SOB with activities of daily living.   Expected Outcomes Short Term: Achieves a reduction of symptoms when performing activities of daily living.   Develop more efficient breathing techniques such as purse lipped breathing and diaphragmatic breathing; and practicing self-pacing with activity Yes   Intervention Provide education, demonstration and support about specific breathing techniuqes utilized for more efficient breathing. Include techniques such as pursed lipped breathing, diaphragmatic breathing and self-pacing activity.   Expected Outcomes Short Term: Participant will be able to demonstrate and use breathing techniques as needed throughout daily activities.   Increase knowledge of respiratory medications and ability to use respiratory devices properly  Yes   Intervention Provide education and demonstration as needed of appropriate use of medications, inhalers, and oxygen therapy.   Expected Outcomes Short Term: Achieves understanding of medications use. Understands that oxygen is a medication prescribed by physician. Demonstrates appropriate use of inhaler and oxygen therapy.   Diabetes Yes  Pre diabetic   Intervention Provide education about signs/symptoms and action to take for hypo/hyperglycemia.;Provide education about proper nutrition, including hydration, and aerobic/resistive exercise prescription along with prescribed  medications to achieve blood glucose in normal ranges: Fasting glucose 65-99 mg/dL   Expected Outcomes Short Term: Participant verbalizes understanding of the signs/symptoms and immediate care of hyper/hypoglycemia, proper foot care and importance of medication, aerobic/resistive exercise and nutrition plan for blood glucose control.;Long Term: Attainment of HbA1C < 7%.   Heart Failure Yes   Intervention Provide a combined exercise and nutrition program that is supplemented with education, support and counseling about heart failure. Directed toward relieving symptoms such as shortness of breath, decreased exercise tolerance, and extremity edema.   Expected Outcomes Improve functional capacity of life;Short term: Attendance in program 2-3 days a week with increased exercise capacity. Reported lower sodium intake. Reported increased fruit and vegetable intake. Reports medication compliance.;Short term: Daily weights obtained and reported for increase. Utilizing diuretic protocols set by physician.;Long term: Adoption of self-care skills and reduction of barriers for early signs and symptoms recognition and intervention leading to self-care maintenance.   Lipids Yes   Intervention Provide education and support for participant on nutrition & aerobic/resistive exercise along with prescribed medications to achieve LDL <27m, HDL >490m   Expected Outcomes Short Term: Participant states understanding of desired cholesterol values and is compliant with medications prescribed. Participant is following exercise prescription and nutrition guidelines.;Long Term: Cholesterol controlled with medications as prescribed, with individualized exercise RX and with personalized nutrition plan. Value goals: LDL < 7086mHDL > 40 mg.       Personal Goals Discharge:     Goals and Risk Factor Review    Row Name 06/20/17 1300 07/08/17 1222 08/03/17 1231         Core Components/Risk Factors/Patient Goals Review   Personal  Goals Review Weight Management/Obesity;Diabetes;Hypertension;Lipids Weight Management/Obesity;Lipids;Hypertension;Improve shortness of breath with ADL's Weight Management/Obesity;Lipids;Hypertension;Improve shortness of breath with ADL's     Review Patient wants to continue to lose weight and is considered pre-diabetic. She recently had lab work done and the doctor told her the results were good.  BobTammi Klippelports she has lost a couple pounds but wishes to keep losing weight. She revealed struggles to cook for one at home but is looking into different nutrition options. She did not want to meet with the nutritionist just yet, but knows that it is an option. She has noticed her decrease in blood pressure and her doctor is happy about her lab values so far. Her ability to  do things at home and out has increased since starting class and she really is excited to see what else she can start doing with her improvement.  Bobbi's weight has been fairly steady.  She feels that she is losing inches but not losing weight.  She continues to eat by herself at home.  Still declining to meet with nutrtionist.  Blood pressures have been going and continues to check at home.  She is doing more at home with less SOB.  Medications seems to working for her and doing well.      Expected Outcomes Short: 1-2 lbs weight loss per week. Long: continue exercise programs and healthy dietary choices to control BP, blood sugars, and lipid panels.  Short: lose 1-3 pounds in the next couple weeks by eating healthier, continue improvement with SOB by attending [facility] and exercising at home on her bike and walking her dog ; Long: lose 30 pounds, continue healthier lifestyle choices after [facility] Short: Continue to work on weight loss at 1-2 lbs a week with watching her diet.  Long: Continue to work on risk factor modification.         Exercise Goals and Review:     Exercise Goals    Row Name 05/24/17 1407             Exercise Goals    Increase Physical Activity Yes       Intervention Provide advice, education, support and counseling about physical activity/exercise needs.;Develop an individualized exercise prescription for aerobic and resistive training based on initial evaluation findings, risk stratification, comorbidities and participant's personal goals.       Expected Outcomes Achievement of increased cardiorespiratory fitness and enhanced flexibility, muscular endurance and strength shown through measurements of functional capacity and personal statement of participant.       Increase Strength and Stamina Yes       Intervention Provide advice, education, support and counseling about physical activity/exercise needs.;Develop an individualized exercise prescription for aerobic and resistive training based on initial evaluation findings, risk stratification, comorbidities and participant's personal goals.       Expected Outcomes Achievement of increased cardiorespiratory fitness and enhanced flexibility, muscular endurance and strength shown through measurements of functional capacity and personal statement of participant.          Nutrition & Weight - Outcomes:     Pre Biometrics - 05/24/17 1407      Pre Biometrics   Height 5' 4.8" (1.646 m)   Weight 217 lb 1.6 oz (98.5 kg)   Waist Circumference 40 inches   Hip Circumference 50.5 inches   Waist to Hip Ratio 0.79 %   BMI (Calculated) 36.4         Post Biometrics - 08/19/17 1209       Post  Biometrics   Height 5' 4.8" (1.646 m)   Weight 217 lb (98.4 kg)   Waist Circumference 42 inches   Hip Circumference 52 inches   Waist to Hip Ratio 0.81 %   BMI (Calculated) 36.33      Nutrition:     Nutrition Therapy & Goals - 08/03/17 1235      Nutrition Therapy   RD appointment defered Yes      Nutrition Discharge:   Education Questionnaire Score:     Knowledge Questionnaire Score - 08/17/17 1151      Knowledge Questionnaire Score   Pre Score 7/10    Post Score 8/10  Reviewed with patient      Goals reviewed  with patient; copy given to patient.

## 2017-08-29 NOTE — Patient Instructions (Signed)
Discharge Instructions  Patient Details  Name: Claire Cortez MRN: [id number] Date of Birth: 1934/09/21 Referring Provider:  Dion Body, MD   Number of Visits: 36/36  Reason for Discharge:  Patient reached a stable level of exercise. Patient independent in their exercise. Patient has met program and personal goals.  Smoking History:  History  Smoking Status  . Former Smoker  . Packs/day: 1.50  . Years: 15.00  . Types: Cigarettes  . Quit date: 08/25/1974  Smokeless Tobacco  . Never Used    Comment: patient has not smoked since 1975    Diagnosis:  Chronic obstructive pulmonary disease, unspecified COPD type ([place])  Initial Exercise Prescription:     Initial Exercise Prescription - 05/24/17 1400      Date of Initial Exercise RX and Referring Provider   Date 05/24/17   Referring Provider Ancil Linsey MD     Treadmill   MPH 1.2   Grade 0   Minutes 15   METs 1.92     NuStep   Level 1   SPM 80   Minutes 15   METs 2     REL-XR   Level 1   Speed 50   Minutes 15   METs 2     Prescription Details   Frequency (times per week) 3   Duration Progress to 45 minutes of aerobic exercise without signs/symptoms of physical distress     Intensity   THRR 40-80% of Max Heartrate 111-130   Ratings of Perceived Exertion 11-13   Perceived Dyspnea 0-4     Progression   Progression Continue to progress workloads to maintain intensity without signs/symptoms of physical distress.     Resistance Training   Training Prescription Yes   Weight 3 lbs   Reps 10-15      Discharge Exercise Prescription (Final Exercise Prescription Changes):     Exercise Prescription Changes - 08/24/17 1500      Response to Exercise   Blood Pressure (Admit) 128/66   Blood Pressure (Exit) 124/70   Heart Rate (Admit) 93 bpm   Heart Rate (Exercise) 130 bpm   Heart Rate (Exit) 103 bpm   Oxygen Saturation (Admit) 96 %   Oxygen Saturation (Exercise) 92 %   Oxygen Saturation  (Exit) 94 %   Rating of Perceived Exertion (Exercise) 12   Perceived Dyspnea (Exercise) 2   Symptoms none   Duration Continue with 45 min of aerobic exercise without signs/symptoms of physical distress.   Intensity THRR unchanged     Progression   Progression Continue to progress workloads to maintain intensity without signs/symptoms of physical distress.   Average METs 3.69     Resistance Training   Training Prescription Yes   Weight 4 lbs   Reps 10-15     Interval Training   Interval Training No     Treadmill   MPH 2   Grade 0.5   Minutes 15   METs 2.67     NuStep   Level 6   SPM 84   Minutes 15   METs 3.2     REL-XR   Level 7   Speed 56   Minutes 15   METs 5.2     Home Exercise Plan   Plans to continue exercise at Home (comment)  walking, stationary bike, arthritis and balance class   Frequency Add 2 additional days to program exercise sessions.   Initial Home Exercises Provided 06/10/17      Functional Capacity:  [place] Name 05/24/17 1343 08/19/17 1209       6 Minute Walk   Phase Initial Discharge    Distance 970 feet 1106 feet    Distance % Change  - 14 %    Distance Feet Change  - 136 ft    Walk Time 4.98 minutes 5.5 minutes    # of Rest Breaks 2 1    MPH 2.21 2.28    METS 7.92 2.76    RPE 13 12    Perceived Dyspnea  2 1    VO2 Peak 6.72 8.01    Symptoms Yes (comment) Yes (comment)    Comments leg fatigue, compensating for Knee, SOB hip/leg pain 4/10    Resting HR 93 bpm 102 bpm    Resting BP 134/56 106/58    Resting Oxygen Saturation   - 96 %    Exercise Oxygen Saturation  during 6 min walk  - 90 %    Max Ex. HR 140 bpm 141 bpm    Max Ex. BP 162/70 174/70    2 Minute Post BP 154/72  -      Interval HR   Baseline HR (retired) 93  -    1 Minute HR 117  -    2 Minute HR 130 125    3 Minute HR 140 133    4 Minute HR 138 141    5 Minute HR 134 139    6 Minute HR 130 141    2 Minute Post HR 102 106    Interval Heart  Rate? Yes  -      Interval Oxygen   Interval Oxygen? Yes  -    Baseline Oxygen Saturation % 92 % 96 %    Resting Liters of Oxygen 0 L  Room Air  -    1 Minute Oxygen Saturation % 92 %  -    1 Minute Liters of Oxygen 0 L 0 L    2 Minute Oxygen Saturation % 91 % 93 %    2 Minute Liters of Oxygen 0 L 0 L    3 Minute Oxygen Saturation % 90 % 94 %    3 Minute Liters of Oxygen 0 L 0 L    4 Minute Oxygen Saturation % 90 % 94 %    4 Minute Liters of Oxygen 0 L 0 L    5 Minute Oxygen Saturation % 93 % 90 %    5 Minute Liters of Oxygen 0 L 0 L    6 Minute Oxygen Saturation % 92 % 93 %    6 Minute Liters of Oxygen 0 L 0 L    2 Minute Post Oxygen Saturation % 94 % 98 %    2 Minute Post Liters of Oxygen 0 L 0 L       Quality of Life:     Quality of Life - 08/17/17 1152      Quality of Life Scores   Health/Function Pre 20.79 %   Health/Function Post 27 %   Health/Function % Change 29.87 %   Socioeconomic Pre 23.33 %   Socioeconomic Post 28 %   Socioeconomic % Change  20.02 %   Psych/Spiritual Pre 23.57 %   Psych/Spiritual Post 29.14 %   Psych/Spiritual % Change 23.63 %   Family Pre 27.5 %   Family Post 30 %   Family % Change 9.09 %   GLOBAL Pre 22.62 %  GLOBAL Post 28.14 %   GLOBAL % Change 24.4 %      Personal Goals: Goals established at orientation with interventions provided to work toward goal.     Personal Goals and Risk Factors at Admission - 05/24/17 1320      Core Components/Risk Factors/Patient Goals on Admission    Weight Management Yes;Obesity   Intervention Weight Management: Develop a combined nutrition and exercise program designed to reach desired caloric intake, while maintaining appropriate intake of nutrient and fiber, sodium and fats, and appropriate energy expenditure required for the weight goal.;Weight Management: Provide education and appropriate resources to help participant work on and attain dietary goals.;Weight Management/Obesity: Establish  reasonable short term and long term weight goals.;Obesity: Provide education and appropriate resources to help participant work on and attain dietary goals.   Admit Weight 217 lb 1.6 oz (98.5 kg)   Goal Weight: Short Term 212 lb (96.2 kg)   Goal Weight: Long Term 180 lb (81.6 kg)   Expected Outcomes Short Term: Continue to assess and modify interventions until short term weight is achieved;Long Term: Adherence to nutrition and physical activity/exercise program aimed toward attainment of established weight goal;Weight Maintenance: Understanding of the daily nutrition guidelines, which includes 25-35% calories from fat, 7% or less cal from saturated fats, less than 234m cholesterol, less than 1.5gm of sodium, & 5 or more servings of fruits and vegetables daily;Weight Loss: Understanding of general recommendations for a balanced deficit meal plan, which promotes 1-2 lb weight loss per week and includes a negative energy balance of [phone] kcal/d;Understanding recommendations for meals to include 15-35% energy as protein, 25-35% energy from fat, 35-60% energy from carbohydrates, less than 2016mof dietary cholesterol, 20-35 gm of total fiber daily;Understanding of distribution of calorie intake throughout the day with the consumption of 4-5 meals/snacks   Tobacco Cessation --  patient does not smoke    Improve shortness of breath with ADL's Yes   Intervention Provide education, individualized exercise plan and daily activity instruction to help decrease symptoms of SOB with activities of daily living.   Expected Outcomes Short Term: Achieves a reduction of symptoms when performing activities of daily living.   Develop more efficient breathing techniques such as purse lipped breathing and diaphragmatic breathing; and practicing self-pacing with activity Yes   Intervention Provide education, demonstration and support about specific breathing techniuqes utilized for more efficient breathing. Include  techniques such as pursed lipped breathing, diaphragmatic breathing and self-pacing activity.   Expected Outcomes Short Term: Participant will be able to demonstrate and use breathing techniques as needed throughout daily activities.   Increase knowledge of respiratory medications and ability to use respiratory devices properly  Yes   Intervention Provide education and demonstration as needed of appropriate use of medications, inhalers, and oxygen therapy.   Expected Outcomes Short Term: Achieves understanding of medications use. Understands that oxygen is a medication prescribed by physician. Demonstrates appropriate use of inhaler and oxygen therapy.   Diabetes Yes  Pre diabetic   Intervention Provide education about signs/symptoms and action to take for hypo/hyperglycemia.;Provide education about proper nutrition, including hydration, and aerobic/resistive exercise prescription along with prescribed medications to achieve blood glucose in normal ranges: Fasting glucose 65-99 mg/dL   Expected Outcomes Short Term: Participant verbalizes understanding of the signs/symptoms and immediate care of hyper/hypoglycemia, proper foot care and importance of medication, aerobic/resistive exercise and nutrition plan for blood glucose control.;Long Term: Attainment of HbA1C < 7%.   Heart Failure Yes   Intervention Provide a combined  exercise and nutrition program that is supplemented with education, support and counseling about heart failure. Directed toward relieving symptoms such as shortness of breath, decreased exercise tolerance, and extremity edema.   Expected Outcomes Improve functional capacity of life;Short term: Attendance in program 2-3 days a week with increased exercise capacity. Reported lower sodium intake. Reported increased fruit and vegetable intake. Reports medication compliance.;Short term: Daily weights obtained and reported for increase. Utilizing diuretic protocols set by physician.;Long term:  Adoption of self-care skills and reduction of barriers for early signs and symptoms recognition and intervention leading to self-care maintenance.   Lipids Yes   Intervention Provide education and support for participant on nutrition & aerobic/resistive exercise along with prescribed medications to achieve LDL <33m, HDL >469m   Expected Outcomes Short Term: Participant states understanding of desired cholesterol values and is compliant with medications prescribed. Participant is following exercise prescription and nutrition guidelines.;Long Term: Cholesterol controlled with medications as prescribed, with individualized exercise RX and with personalized nutrition plan. Value goals: LDL < 7040mHDL > 40 mg.       Personal Goals Discharge:     Goals and Risk Factor Review - 08/03/17 1231      Core Components/Risk Factors/Patient Goals Review   Personal Goals Review Weight Management/Obesity;Lipids;Hypertension;Improve shortness of breath with ADL's   Review Bobbi's weight has been fairly steady.  She feels that she is losing inches but not losing weight.  She continues to eat by herself at home.  Still declining to meet with nutrtionist.  Blood pressures have been going and continues to check at home.  She is doing more at home with less SOB.  Medications seems to working for her and doing well.    Expected Outcomes Short: Continue to work on weight loss at 1-2 lbs a week with watching her diet.  Long: Continue to work on risk factor modification.       Exercise Goals and Review:     Exercise Goals    Row Name 05/24/17 1407             Exercise Goals   Increase Physical Activity Yes       Intervention Provide advice, education, support and counseling about physical activity/exercise needs.;Develop an individualized exercise prescription for aerobic and resistive training based on initial evaluation findings, risk stratification, comorbidities and participant's personal goals.        Expected Outcomes Achievement of increased cardiorespiratory fitness and enhanced flexibility, muscular endurance and strength shown through measurements of functional capacity and personal statement of participant.       Increase Strength and Stamina Yes       Intervention Provide advice, education, support and counseling about physical activity/exercise needs.;Develop an individualized exercise prescription for aerobic and resistive training based on initial evaluation findings, risk stratification, comorbidities and participant's personal goals.       Expected Outcomes Achievement of increased cardiorespiratory fitness and enhanced flexibility, muscular endurance and strength shown through measurements of functional capacity and personal statement of participant.          Nutrition & Weight - Outcomes:     Pre Biometrics - 05/24/17 1407      Pre Biometrics   Height 5' 4.8" (1.646 m)   Weight 217 lb 1.6 oz (98.5 kg)   Waist Circumference 40 inches   Hip Circumference 50.5 inches   Waist to Hip Ratio 0.79 %   BMI (Calculated) 36.4         Post Biometrics - 08/19/17 1209  Post  Biometrics   Height 5' 4.8" (1.646 m)   Weight 217 lb (98.4 kg)   Waist Circumference 42 inches   Hip Circumference 52 inches   Waist to Hip Ratio 0.81 %   BMI (Calculated) 36.33      Nutrition:     Nutrition Therapy & Goals - 08/03/17 1235      Nutrition Therapy   RD appointment defered Yes      Nutrition Discharge:   Education Questionnaire Score:     Knowledge Questionnaire Score - 08/17/17 1151      Knowledge Questionnaire Score   Pre Score 7/10   Post Score 8/10  Reviewed with patient      Goals reviewed with patient; copy given to patient.

## 2017-09-28 ENCOUNTER — Telehealth (INDEPENDENT_AMBULATORY_CARE_PROVIDER_SITE_OTHER): Payer: Self-pay | Admitting: Vascular Surgery

## 2017-09-28 NOTE — Telephone Encounter (Signed)
error 

## 2017-10-17 ENCOUNTER — Encounter (INDEPENDENT_AMBULATORY_CARE_PROVIDER_SITE_OTHER): Payer: Self-pay | Admitting: Vascular Surgery

## 2017-10-17 ENCOUNTER — Ambulatory Visit (INDEPENDENT_AMBULATORY_CARE_PROVIDER_SITE_OTHER): Payer: Medicare Other | Admitting: Vascular Surgery

## 2017-10-17 VITALS — BP 139/73 | HR 90 | Resp 17 | Wt 219.8 lb

## 2017-10-17 DIAGNOSIS — I70219 Atherosclerosis of native arteries of extremities with intermittent claudication, unspecified extremity: Secondary | ICD-10-CM | POA: Diagnosis not present

## 2017-10-17 DIAGNOSIS — I10 Essential (primary) hypertension: Secondary | ICD-10-CM | POA: Diagnosis not present

## 2017-10-17 DIAGNOSIS — I872 Venous insufficiency (chronic) (peripheral): Secondary | ICD-10-CM | POA: Diagnosis not present

## 2017-10-17 DIAGNOSIS — I2510 Atherosclerotic heart disease of native coronary artery without angina pectoris: Secondary | ICD-10-CM | POA: Diagnosis not present

## 2017-10-18 ENCOUNTER — Encounter (INDEPENDENT_AMBULATORY_CARE_PROVIDER_SITE_OTHER): Payer: Medicare Other

## 2017-10-22 ENCOUNTER — Encounter (INDEPENDENT_AMBULATORY_CARE_PROVIDER_SITE_OTHER): Payer: Self-pay | Admitting: Vascular Surgery

## 2017-10-22 DIAGNOSIS — I872 Venous insufficiency (chronic) (peripheral): Secondary | ICD-10-CM | POA: Insufficient documentation

## 2017-10-22 DIAGNOSIS — I70219 Atherosclerosis of native arteries of extremities with intermittent claudication, unspecified extremity: Secondary | ICD-10-CM | POA: Insufficient documentation

## 2017-10-22 NOTE — Progress Notes (Signed)
MRN : [id number]  Claire ReedyBarbara Ann Blancett is a 83 y.o. (05-13-1934) female who presents with chief complaint of  Chief Complaint  Patient presents with  . Follow-up    bil leg pain  .  History of Present Illness: The patient is seen for evaluation of painful lower extremities. Patient notes the pain is variable and not always associated with activity.  The pain is somewhat consistent day to day occurring on most days. The patient notes the pain also occurs with standing and routinely seems worse as the day wears on. The pain has been progressive over the past several years. The patient states these symptoms are causing  a profound negative impact on quality of life and daily activities.  The patient denies rest pain or dangling of an extremity off the side of the bed during the night for relief. No open wounds or sores at this time. No history of DVT or phlebitis. No prior interventions or surgeries.  There is a  history of back problems and DJD of the lumbar and sacral spine.    Current Meds  Medication Sig  . amitriptyline (ELAVIL) 25 MG tablet Take by mouth as needed.  Marland Kitchen. aspirin EC 81 MG tablet Take by mouth.  . CO ENZYME Q-10 PO Take 200 mg by mouth.  . Glucosamine HCl (SM GLUCOSAMINE HCL) 1500 MG TABS Take by mouth.  . levothyroxine (SYNTHROID, LEVOTHROID) 88 MCG tablet TAKE 1 TABLET DAILY ON AN EMPTY STOMACH WITH A GLASS OF WATER AT LEAST 30 TO 60 MINUTES BEFORE BREAKFAST  . lisinopril (PRINIVIL,ZESTRIL) 10 MG tablet TAKE 1 TABLET DAILY  . Multiple Vitamin (MULTI-VITAMINS) TABS Take by mouth.  . Omega-3 Fatty Acids (FISH OIL) 1200 MG CAPS Take by mouth.  . OXYGEN Inhale 2 L into the lungs at bedtime.  Marland Kitchen. PROAIR HFA 108 (90 Base) MCG/ACT inhaler   . rosuvastatin (CRESTOR) 10 MG tablet TAKE 1 TABLET NIGHTLY    Past Medical History:  Diagnosis Date  . Acquired hypothyroidism 08/25/2015  . Arteriosclerosis of coronary artery 08/25/2015   Overview:  Sp cabg x3 with graft stent  2007   . Arthritis of knee, degenerative 04/10/2015  . Benign essential HTN 10/24/2014  . Borderline diabetes mellitus 08/25/2015  . Chronic cervical pain 08/25/2015  . Chronic diarrhea 09/24/2014  . Combined fat and carbohydrate induced hyperlipemia 10/24/2014    Past Surgical History:  Procedure Laterality Date  . CERVICAL FUSION  1992  . CORONARY ARTERY BYPASS GRAFT     x3  . KNEE ARTHROSCOPY      Social History Social History   Tobacco Use  . Smoking status: Former Smoker    Packs/day: 1.50    Years: 15.00    Pack years: 22.50    Types: Cigarettes    Last attempt to quit: 08/25/1974    Years since quitting: 43.1  . Smokeless tobacco: Never Used  . Tobacco comment: patient has not smoked since 1975  Substance Use Topics  . Alcohol use: Yes    Alcohol/week: 3.6 oz    Types: 6 Glasses of wine per week  . Drug use: No    Family History Family History  Problem Relation Age of Onset  . Kidney cancer Neg Hx   . Kidney disease Neg Hx   . Prostate cancer Neg Hx   No family history of bleeding/clotting disorders, porphyria or autoimmune disease   Allergies  Allergen Reactions  . Demerol [Meperidine]   . Emetrol Nausea And Vomiting  .  Lopressor  [Metoprolol Tartrate]     Other reaction(s): Unknown intolerance  . Nsaids     Other reaction(s): Other (See Comments) Causes elevation in BP  . Sulfa Antibiotics Rash     REVIEW OF SYSTEMS (Negative unless checked)  Constitutional: [] Weight loss  [] Fever  [] Chills Cardiac: [] Chest pain   [] Chest pressure   [] Palpitations   [] Shortness of breath when laying flat   [] Shortness of breath with exertion. Vascular:  [x] Pain in legs with walking   [] Pain in legs at rest  [] History of DVT   [] Phlebitis   [x] Swelling in legs   [x] Varicose veins   [] Non-healing ulcers Pulmonary:   [] Uses home oxygen   [] Productive cough   [] Hemoptysis   [] Wheeze  [] COPD   [] Asthma Neurologic:  [] Dizziness   [] Seizures   [] History of stroke    [] History of TIA  [] Aphasia   [] Vissual changes   [] Weakness or numbness in arm   [] Weakness or numbness in leg Musculoskeletal:   [] Joint swelling   [] Joint pain   [] Low back pain Hematologic:  [] Easy bruising  [] Easy bleeding   [] Hypercoagulable state   [] Anemic Gastrointestinal:  [] Diarrhea   [] Vomiting  [] Gastroesophageal reflux/heartburn   [] Difficulty swallowing. Genitourinary:  [] Chronic kidney disease   [] Difficult urination  [] Frequent urination   [] Blood in urine Skin:  [] Rashes   [] Ulcers  Psychological:  [] History of anxiety   []  History of major depression.  Physical Examination  Vitals:   10/17/17 1545  BP: 139/73  Pulse: 90  Resp: 17  Weight: 99.7 kg (219 lb 12.8 oz)   Body mass index is 36.8 kg/m. Gen: WD/WN, NAD Head: [place]/AT, No temporalis wasting.  Ear/Nose/Throat: Hearing grossly intact, nares w/o erythema or drainage, poor dentition Eyes: PER, EOMI, sclera nonicteric.  Neck: Supple, no masses.  No bruit or JVD.  Pulmonary:  Good air movement, clear to auscultation bilaterally, no use of accessory muscles.  Cardiac: RRR, normal S1, S2, no Murmurs. Vascular: scattered varicosities present bilaterally.  Mild venous stasis changes to the legs bilaterally.  2+ soft pitting edema  Vessel Right Left  Radial Palpable Palpable  Ulnar Palpable Palpable  Brachial Palpable Palpable  Carotid Palpable Palpable  Femoral 1+ Palpable 1+Palpable  Popliteal Trace Palpable Trace Palpable  PT Trace Palpable Trace Palpable  DP Trace Palpable Trace Palpable   Gastrointestinal: soft, non-distended. No guarding/no peritoneal signs.  Musculoskeletal: M/S 5/5 throughout.  No deformity or atrophy.  Neurologic: CN 2-12 intact. Pain and light touch intact in extremities.  Symmetrical.  Speech is fluent. Motor exam as listed above. Psychiatric: Judgment intact, Mood & affect appropriate for pt's clinical situation. Dermatologic: venous rashes no ulcers noted.  No changes consistent with  cellulitis. Lymph : No Cervical lymphadenopathy, no lichenification or skin changes of chronic lymphedema.  CBC Lab Results  Component Value Date   WBC 10.1 10/19/2014   HGB 12.7 10/19/2014   HCT 40.2 10/19/2014   MCV 90 10/19/2014   PLT 221 10/19/2014    BMET    Component Value Date/Time   NA 142 10/19/2014 1453   K 4.1 10/19/2014 1453   CL 109 (H) 10/19/2014 1453   CO2 25 10/19/2014 1453   GLUCOSE 91 10/19/2014 1453   BUN 18 10/19/2014 1453   CREATININE 1.29 10/19/2014 1453   CALCIUM 8.6 10/19/2014 1453   GFRNONAA 42 (L) 10/19/2014 1453   GFRAA 51 (L) 10/19/2014 1453   CrCl cannot be calculated (Patient's most recent lab result is older than the maximum  21 days allowed.).  COAG No results found for: INR, PROTIME  Radiology No results found.  Assessment/Plan 1. Atherosclerosis of native artery of lower extremity with intermittent claudication, unspecified laterality (HCC)  Recommend:  The patient has atypical pain symptoms for pure atherosclerotic disease. However, on physical exam there is evidence of mixed venous and arterial disease, given the diminished pulses and the edema associated with venous changes of the legs.  Noninvasive studies including ABI's and venous ultrasound of the legs will be obtained and the patient will follow up with me to review these studies.  The patient should continue walking and begin a more formal exercise program. The patient should continue his antiplatelet therapy and aggressive treatment of the lipid abnormalities.  The patient should begin wearing graduated compression socks 15-20 mmHg strength to control edema.   2. Chronic venous insufficiency No surgery or intervention at this point in time.    I have had a long discussion with the patient regarding venous insufficiency and why it  causes symptoms. I have discussed with the patient the chronic skin changes that accompany venous insufficiency and the long term sequela such as  infection and ulceration.  Patient will begin wearing graduated compression stockings class 1 (20-30 mmHg) or compression wraps on a daily basis a prescription was given. The patient will put the stockings on first thing in the morning and removing them in the evening. The patient is instructed specifically not to sleep in the stockings.    In addition, behavioral modification including several periods of elevation of the lower extremities during the day will be continued. I have demonstrated that proper elevation is a position with the ankles at heart level.  The patient is instructed to begin routine exercise, especially walking on a daily basis  Patient should undergo duplex ultrasound of the venous system to ensure that DVT or reflux is not present.  Following the review of the ultrasound the patient will follow up in 2-3 months to reassess the degree of swelling and the control that graduated compression stockings or compression wraps  is offering.   The patient can be assessed for a Lymph Pump at that time  3. Arteriosclerosis of coronary artery Continue cardiac and antihypertensive medications as already ordered and reviewed, no changes at this time.  Continue statin as ordered and reviewed, no changes at this time  Nitrates PRN for chest pain   4. Benign essential HTN Continue antihypertensive medications as already ordered, these medications have been reviewed and there are no changes at this time.     Levora Dredge, MD  10/22/2017 3:39 PM

## 2017-11-02 ENCOUNTER — Ambulatory Visit (INDEPENDENT_AMBULATORY_CARE_PROVIDER_SITE_OTHER): Payer: Medicare Other

## 2017-11-02 DIAGNOSIS — I872 Venous insufficiency (chronic) (peripheral): Secondary | ICD-10-CM

## 2017-11-24 ENCOUNTER — Ambulatory Visit (INDEPENDENT_AMBULATORY_CARE_PROVIDER_SITE_OTHER): Payer: Medicare Other

## 2017-11-24 ENCOUNTER — Ambulatory Visit (INDEPENDENT_AMBULATORY_CARE_PROVIDER_SITE_OTHER): Payer: Medicare Other | Admitting: Vascular Surgery

## 2017-11-24 ENCOUNTER — Encounter (INDEPENDENT_AMBULATORY_CARE_PROVIDER_SITE_OTHER): Payer: Self-pay | Admitting: Vascular Surgery

## 2017-11-24 ENCOUNTER — Encounter (INDEPENDENT_AMBULATORY_CARE_PROVIDER_SITE_OTHER): Payer: Medicare Other

## 2017-11-24 ENCOUNTER — Encounter (INDEPENDENT_AMBULATORY_CARE_PROVIDER_SITE_OTHER): Payer: Self-pay

## 2017-11-24 VITALS — BP 162/75 | HR 79 | Resp 16 | Wt 216.8 lb

## 2017-11-24 DIAGNOSIS — I70219 Atherosclerosis of native arteries of extremities with intermittent claudication, unspecified extremity: Secondary | ICD-10-CM

## 2017-11-24 DIAGNOSIS — I10 Essential (primary) hypertension: Secondary | ICD-10-CM | POA: Diagnosis not present

## 2017-11-24 DIAGNOSIS — I2510 Atherosclerotic heart disease of native coronary artery without angina pectoris: Secondary | ICD-10-CM

## 2017-11-24 DIAGNOSIS — I6523 Occlusion and stenosis of bilateral carotid arteries: Secondary | ICD-10-CM

## 2017-11-24 DIAGNOSIS — I872 Venous insufficiency (chronic) (peripheral): Secondary | ICD-10-CM

## 2017-11-24 NOTE — Progress Notes (Signed)
MRN : [id number]  Claire Cortez is a 84 y.o. (November 27, 1933) female who presents with chief complaint of  Chief Complaint  Patient presents with  . Follow-up    ultrasound follow up  .  History of Present Illness: The patient returns to the office for followup and review of the noninvasive studies. There have been no interval changes in lower extremity symptoms. No interval shortening of the patient's claudication distance or development of rest pain symptoms. No new ulcers or wounds have occurred since the last visit.  There have been no significant changes to the patient's overall health care.  The patient denies amaurosis fugax or recent TIA symptoms. There are no recent neurological changes noted. The patient denies history of DVT, PE or superficial thrombophlebitis. The patient denies recent episodes of angina or shortness of breath.   ABI Rt=1.03 and Lt=0.92  (previous ABI's Rt=0.99 and Lt=0.79) Duplex ultrasound of the aorta iliac shows a stable stenosis of the common iliac arteries  Current Meds  Medication Sig  . amitriptyline (ELAVIL) 25 MG tablet Take by mouth as needed.  Marland Kitchen aspirin EC 81 MG tablet Take by mouth.  . CO ENZYME Q-10 PO Take 200 mg by mouth.  . Glucosamine HCl (SM GLUCOSAMINE HCL) 1500 MG TABS Take by mouth.  . levothyroxine (SYNTHROID, LEVOTHROID) 88 MCG tablet TAKE 1 TABLET DAILY ON AN EMPTY STOMACH WITH A GLASS OF WATER AT LEAST 30 TO 60 MINUTES BEFORE BREAKFAST  . lisinopril (PRINIVIL,ZESTRIL) 10 MG tablet TAKE 1 TABLET DAILY  . Multiple Vitamin (MULTI-VITAMINS) TABS Take by mouth.  . Omega-3 Fatty Acids (FISH OIL) 1200 MG CAPS Take by mouth.  . OXYGEN Inhale 2 L into the lungs at bedtime.  Marland Kitchen PROAIR HFA 108 (90 Base) MCG/ACT inhaler   . rosuvastatin (CRESTOR) 10 MG tablet TAKE 1 TABLET NIGHTLY  . traMADol (ULTRAM) 50 MG tablet Take by mouth.    Past Medical History:  Diagnosis Date  . Acquired hypothyroidism 08/25/2015  . Arteriosclerosis of  coronary artery 08/25/2015   Overview:  Sp cabg x3 with graft stent 2007   . Arthritis of knee, degenerative 04/10/2015  . Benign essential HTN 10/24/2014  . Borderline diabetes mellitus 08/25/2015  . Chronic cervical pain 08/25/2015  . Chronic diarrhea 09/24/2014  . Combined fat and carbohydrate induced hyperlipemia 10/24/2014    Past Surgical History:  Procedure Laterality Date  . CERVICAL FUSION  1992  . CORONARY ARTERY BYPASS GRAFT     x3  . KNEE ARTHROSCOPY      Social History Social History   Tobacco Use  . Smoking status: Former Smoker    Packs/day: 1.50    Years: 15.00    Pack years: 22.50    Types: Cigarettes    Last attempt to quit: 08/25/1974    Years since quitting: 43.2  . Smokeless tobacco: Never Used  . Tobacco comment: patient has not smoked since 1975  Substance Use Topics  . Alcohol use: Yes    Alcohol/week: 3.6 oz    Types: 6 Glasses of wine per week  . Drug use: No    Family History Family History  Problem Relation Age of Onset  . Kidney cancer Neg Hx   . Kidney disease Neg Hx   . Prostate cancer Neg Hx     Allergies  Allergen Reactions  . Demerol [Meperidine]   . Emetrol Nausea And Vomiting  . Lopressor  [Metoprolol Tartrate]     Other reaction(s): Unknown intolerance  .  Nsaids     Other reaction(s): Other (See Comments) Causes elevation in BP  . Sulfa Antibiotics Rash     REVIEW OF SYSTEMS (Negative unless checked)  Constitutional: [] Weight loss  [] Fever  [] Chills Cardiac: [] Chest pain   [] Chest pressure   [] Palpitations   [] Shortness of breath when laying flat   [] Shortness of breath with exertion. Vascular:  [x] Pain in legs with walking   [] Pain in legs at rest  [] History of DVT   [] Phlebitis   [] Swelling in legs   [] Varicose veins   [] Non-healing ulcers Pulmonary:   [] Uses home oxygen   [] Productive cough   [] Hemoptysis   [] Wheeze  [] COPD   [] Asthma Neurologic:  [] Dizziness   [] Seizures   [] History of stroke   [] History of TIA   [] Aphasia   [] Vissual changes   [] Weakness or numbness in arm   [] Weakness or numbness in leg Musculoskeletal:   [] Joint swelling   [] Joint pain   [] Low back pain Hematologic:  [] Easy bruising  [] Easy bleeding   [] Hypercoagulable state   [] Anemic Gastrointestinal:  [] Diarrhea   [] Vomiting  [] Gastroesophageal reflux/heartburn   [] Difficulty swallowing. Genitourinary:  [] Chronic kidney disease   [] Difficult urination  [] Frequent urination   [] Blood in urine Skin:  [] Rashes   [] Ulcers  Psychological:  [] History of anxiety   []  History of major depression.  Physical Examination  Vitals:   11/24/17 1031  BP: (!) 162/75  Pulse: 79  Resp: 16  Weight: 98.3 kg (216 lb 12.8 oz)   Body mass index is 36.3 kg/m. Gen: WD/WN, NAD Head: [place]/AT, No temporalis wasting.  Ear/Nose/Throat: Hearing grossly intact, nares w/o erythema or drainage Eyes: PER, EOMI, sclera nonicteric.  Neck: Supple, no large masses.   Pulmonary:  Good air movement, no audible wheezing bilaterally, no use of accessory muscles.  Cardiac: RRR, no JVD Vascular:  Bilateral carotid bruits Vessel Right Left  Radial Palpable Palpable  Brachial Palpable Palpable  Carotid Palpable Palpable  Femoral Palpable Palpable  PT Trace Palpable Trace Palpable  DP Trace Palpable Trace Palpable  Gastrointestinal: Non-distended. No guarding/no peritoneal signs.  Musculoskeletal: M/S 5/5 throughout.  No deformity or atrophy.  Neurologic: CN 2-12 intact. Symmetrical.  Speech is fluent. Motor exam as listed above. Psychiatric: Judgment intact, Mood & affect appropriate for pt's clinical situation. Dermatologic: No rashes or ulcers noted.  No changes consistent with cellulitis. Lymph : No lichenification or skin changes of chronic lymphedema.  CBC Lab Results  Component Value Date   WBC 10.1 10/19/2014   HGB 12.7 10/19/2014   HCT 40.2 10/19/2014   MCV 90 10/19/2014   PLT 221 10/19/2014    BMET    Component Value Date/Time   NA 142  10/19/2014 1453   K 4.1 10/19/2014 1453   CL 109 (H) 10/19/2014 1453   CO2 25 10/19/2014 1453   GLUCOSE 91 10/19/2014 1453   BUN 18 10/19/2014 1453   CREATININE 1.29 10/19/2014 1453   CALCIUM 8.6 10/19/2014 1453   GFRNONAA 42 (L) 10/19/2014 1453   GFRAA 51 (L) 10/19/2014 1453   CrCl cannot be calculated (Patient's most recent lab result is older than the maximum 21 days allowed.).  COAG No results found for: INR, PROTIME  Radiology No results found.  Assessment/Plan 1. Atherosclerosis of native artery of lower extremity with intermittent claudication, unspecified laterality (HCC)  Recommend:  The patient has evidence of atherosclerosis of the lower extremities with claudication.  The patient does not voice lifestyle limiting changes at this point in  time.  Noninvasive studies do not suggest clinically significant change.  No invasive studies, angiography or surgery at this time The patient should continue walking and begin a more formal exercise program.  The patient should continue antiplatelet therapy and aggressive treatment of the lipid abnormalities  No changes in the patient's medications at this time  The patient should continue wearing graduated compression socks 10-15 mmHg strength to control the mild edema.    2. Bilateral carotid artery stenosis New finding of carotid bruit  I will order a carotid duplex  3. Chronic venous insufficiency No surgery or intervention at this point in time.    I have had a long discussion with the patient regarding venous insufficiency and why it  causes symptoms. I have discussed with the patient the chronic skin changes that accompany venous insufficiency and the long term sequela such as infection and ulceration.  Patient will begin wearing graduated compression stockings class 1 (20-30 mmHg) or compression wraps on a daily basis a prescription was given. The patient will put the stockings on first thing in the morning and removing  them in the evening. The patient is instructed specifically not to sleep in the stockings.    In addition, behavioral modification including several periods of elevation of the lower extremities during the day will be continued. I have demonstrated that proper elevation is a position with the ankles at heart level.  The patient is instructed to begin routine exercise, especially walking on a daily basis  The patient will follow up in 6 months to reassess the degree of swelling and the control that graduated compression stockings or compression wraps  is offering.   The patient can be assessed for a Lymph Pump at that time  4. Benign essential HTN Continue antihypertensive medications as already ordered, these medications have been reviewed and there are no changes at this time.   5. Arteriosclerosis of coronary artery Continue cardiac and antihypertensive medications as already ordered and reviewed, no changes at this time.  Continue statin as ordered and reviewed, no changes at this time  Nitrates PRN for chest pain     Levora Dredge, MD  11/24/2017 10:50 AM

## 2017-11-27 ENCOUNTER — Encounter (INDEPENDENT_AMBULATORY_CARE_PROVIDER_SITE_OTHER): Payer: Self-pay | Admitting: Vascular Surgery

## 2017-11-27 DIAGNOSIS — I6529 Occlusion and stenosis of unspecified carotid artery: Secondary | ICD-10-CM | POA: Insufficient documentation

## 2017-12-14 ENCOUNTER — Ambulatory Visit (INDEPENDENT_AMBULATORY_CARE_PROVIDER_SITE_OTHER): Payer: Medicare Other

## 2017-12-14 ENCOUNTER — Encounter (INDEPENDENT_AMBULATORY_CARE_PROVIDER_SITE_OTHER): Payer: Self-pay | Admitting: Vascular Surgery

## 2017-12-14 ENCOUNTER — Ambulatory Visit (INDEPENDENT_AMBULATORY_CARE_PROVIDER_SITE_OTHER): Payer: Medicare Other | Admitting: Vascular Surgery

## 2017-12-14 VITALS — BP 152/73 | HR 78 | Resp 16 | Wt 219.0 lb

## 2017-12-14 DIAGNOSIS — I6523 Occlusion and stenosis of bilateral carotid arteries: Secondary | ICD-10-CM | POA: Diagnosis not present

## 2017-12-14 DIAGNOSIS — E782 Mixed hyperlipidemia: Secondary | ICD-10-CM

## 2017-12-14 DIAGNOSIS — R7303 Prediabetes: Secondary | ICD-10-CM

## 2017-12-14 NOTE — Progress Notes (Signed)
Subjective:    Patient ID: Claire Cortez, female    DOB: 03-21-1934, 84 y.o.   MRN: [id number] Chief Complaint  Patient presents with  . Follow-up    pt conv carotid   Patient presents to review vascular studies.  The patient was last seen on November 24, 2017 for evaluation of peripheral artery disease.  Patient was found to have a new bruit on exam by Dr. Lady Gary.  A carotid duplex was notable for 1-39% stenosis of the bilateral internal carotid arteries.  Both vertebral arteries were patent with antegrade flow.  Normal flow hemodynamics were seen in the bilateral subclavian arteries. The patient denies experiencing Amaurosis Fugax, TIA like symptoms or focal motor deficits.  Patient denies any fever, nausea vomiting.   Review of Systems  Constitutional: Negative.   HENT: Negative.   Eyes: Negative.   Respiratory: Negative.   Cardiovascular: Negative.   Gastrointestinal: Negative.   Endocrine: Negative.   Genitourinary: Negative.   Musculoskeletal: Negative.   Skin: Negative.   Allergic/Immunologic: Negative.   Neurological: Negative.   Hematological: Negative.   Psychiatric/Behavioral: Negative.       Objective:   Physical Exam  Constitutional: She is oriented to person, place, and time. She appears well-developed and well-nourished. No distress.  HENT:  Head: Normocephalic and atraumatic.  Eyes: Conjunctivae are normal. Pupils are equal, round, and reactive to light.  Neck: Normal range of motion.  Left carotid bruit noted on exam  Cardiovascular: Normal rate, regular rhythm, normal heart sounds and intact distal pulses.  Pulses:      Radial pulses are 2+ on the right side, and 2+ on the left side.  Pulmonary/Chest: Effort normal and breath sounds normal.  Musculoskeletal: Normal range of motion. She exhibits no edema.  Neurological: She is alert and oriented to person, place, and time.  Skin: Skin is warm and dry. She is not diaphoretic.  Psychiatric: She has a  normal mood and affect. Her behavior is normal. Judgment and thought content normal.  Vitals reviewed.  BP (!) 152/73 (BP Location: Right Arm)   Pulse 78   Resp 16   Wt 219 lb (99.3 kg)   BMI 36.67 kg/m   Past Medical History:  Diagnosis Date  . Acquired hypothyroidism 08/25/2015  . Arteriosclerosis of coronary artery 08/25/2015   Overview:  Sp cabg x3 with graft stent 2007   . Arthritis of knee, degenerative 04/10/2015  . Benign essential HTN 10/24/2014  . Borderline diabetes mellitus 08/25/2015  . Chronic cervical pain 08/25/2015  . Chronic diarrhea 09/24/2014  . Combined fat and carbohydrate induced hyperlipemia 10/24/2014   Social History   Socioeconomic History  . Marital status: Widowed    Spouse name: Not on file  . Number of children: Not on file  . Years of education: Not on file  . Highest education level: Not on file  Social Needs  . Financial resource strain: Not on file  . Food insecurity - worry: Not on file  . Food insecurity - inability: Not on file  . Transportation needs - medical: Not on file  . Transportation needs - non-medical: Not on file  Occupational History  . Not on file  Tobacco Use  . Smoking status: Former Smoker    Packs/day: 1.50    Years: 15.00    Pack years: 22.50    Types: Cigarettes    Last attempt to quit: 08/25/1974    Years since quitting: 43.3  . Smokeless tobacco: Never Used  .  Tobacco comment: patient has not smoked since 1975  Substance and Sexual Activity  . Alcohol use: Yes    Alcohol/week: 3.6 oz    Types: 6 Glasses of wine per week  . Drug use: No  . Sexual activity: Not on file  Other Topics Concern  . Not on file  Social History Narrative  . Not on file   Past Surgical History:  Procedure Laterality Date  . CERVICAL FUSION  1992  . CORONARY ARTERY BYPASS GRAFT     x3  . KNEE ARTHROSCOPY     Family History  Problem Relation Age of Onset  . Kidney cancer Neg Hx   . Kidney disease Neg Hx   . Prostate  cancer Neg Hx    Allergies  Allergen Reactions  . Demerol [Meperidine]   . Emetrol Nausea And Vomiting  . Lopressor  [Metoprolol Tartrate]     Other reaction(s): Unknown intolerance  . Nsaids     Other reaction(s): Other (See Comments) Causes elevation in BP  . Sulfa Antibiotics Rash      Assessment & Plan:  Patient presents to review vascular studies.  The patient was last seen on November 24, 2017 for evaluation of peripheral artery disease.  Patient was found to have a new bruit on exam by Dr. Lady Cortez.  A carotid duplex was notable for 1-39% stenosis of the bilateral internal carotid arteries.  Both vertebral arteries were patent with antegrade flow.  Normal flow hemodynamics were seen in the bilateral subclavian arteries. The patient denies experiencing Amaurosis Fugax, TIA like symptoms or focal motor deficits.  Patient denies any fever, nausea vomiting.  1. Bilateral carotid artery stenosis - Stable Studies reviewed with patient. Patient asymptomatic with stable duplex.  No intervention at this time.  Patient to return in one year for surveillance carotid duplex. Patient to remain abstinent of tobacco use. I have discussed with the patient at length the risk factors for and pathogenesis of atherosclerotic disease and encouraged a healthy diet, regular exercise regimen and blood pressure / glucose control.  Patient was instructed to contact our office in the interim with problems such as arm / leg weakness or numbness, speech / swallowing difficulty or temporary monocular blindness. The patient expresses their understanding.   - VAS US CAROTID; Future  2. Combined fat and carbohydrate induced hyperlipemia - Stable Encouraged good control as its slows the progression of atherosclerotic disease  3. Borderline diabetes mellitus - Stable Encouraged good control as its slows the progression of atherosclerotic disease  Current Outpatient Medications on File Prior to Visit  Medication  Sig Dispense Refill  . amitriptyline (ELAVIL) 25 MG tablet Take by mouth as needed.    Marland Kitchen. aspirin EC 81 MG tablet Take by mouth.    . CO ENZYME Q-10 PO Take 200 mg by mouth.    . Glucosamine HCl (SM GLUCOSAMINE HCL) 1500 MG TABS Take by mouth.    . levothyroxine (SYNTHROID, LEVOTHROID) 88 MCG tablet TAKE 1 TABLET DAILY ON AN EMPTY STOMACH WITH A GLASS OF WATER AT LEAST 30 TO 60 MINUTES BEFORE BREAKFAST    . lisinopril (PRINIVIL,ZESTRIL) 10 MG tablet TAKE 1 TABLET DAILY    . Multiple Vitamin (MULTI-VITAMINS) TABS Take by mouth.    . Omega-3 Fatty Acids (FISH OIL) 1200 MG CAPS Take by mouth.    . OXYGEN Inhale 2 L into the lungs at bedtime.    Marland Kitchen. PROAIR HFA 108 (90 Base) MCG/ACT inhaler     . rosuvastatin (CRESTOR)  10 MG tablet TAKE 1 TABLET NIGHTLY    . solifenacin (VESICARE) 5 MG tablet Take 1 tablet (5 mg total) by mouth daily. 30 tablet 3  . traMADol (ULTRAM) 50 MG tablet Take by mouth.    Marland Kitchen amitriptyline (ELAVIL) 25 MG tablet Take by mouth.    . conjugated estrogens (PREMARIN) vaginal cream Apply blueberry sized amount to vaginal opening with finger tip M-W-F nights. (Patient not taking: Reported on 10/17/2017) 42.5 g 12   No current facility-administered medications on file prior to visit.    There are no Patient Instructions on file for this visit. Return in about 1 year (around 12/14/2018), or if symptoms worsen or fail to improve.  Brin Ruggerio A Derryl Uher, PA-C

## 2018-05-22 ENCOUNTER — Ambulatory Visit (INDEPENDENT_AMBULATORY_CARE_PROVIDER_SITE_OTHER): Payer: Medicare Other | Admitting: Vascular Surgery

## 2018-05-22 ENCOUNTER — Encounter (INDEPENDENT_AMBULATORY_CARE_PROVIDER_SITE_OTHER): Payer: Medicare Other

## 2018-05-29 ENCOUNTER — Encounter (INDEPENDENT_AMBULATORY_CARE_PROVIDER_SITE_OTHER): Payer: Medicare Other

## 2018-06-05 ENCOUNTER — Ambulatory Visit (INDEPENDENT_AMBULATORY_CARE_PROVIDER_SITE_OTHER): Payer: Medicare Other | Admitting: Vascular Surgery

## 2018-06-05 ENCOUNTER — Encounter (INDEPENDENT_AMBULATORY_CARE_PROVIDER_SITE_OTHER): Payer: Medicare Other

## 2018-06-08 ENCOUNTER — Other Ambulatory Visit: Payer: Self-pay | Admitting: Family Medicine

## 2018-06-08 DIAGNOSIS — G8929 Other chronic pain: Secondary | ICD-10-CM

## 2018-06-08 DIAGNOSIS — M5441 Lumbago with sciatica, right side: Principal | ICD-10-CM

## 2018-06-08 DIAGNOSIS — M5442 Lumbago with sciatica, left side: Principal | ICD-10-CM

## 2018-06-18 ENCOUNTER — Ambulatory Visit
Admission: RE | Admit: 2018-06-18 | Discharge: 2018-06-18 | Disposition: A | Discharge status: Home / Self Care | Payer: Medicare Other | Source: Ambulatory Visit | Attending: Family Medicine | Admitting: Family Medicine

## 2018-06-18 DIAGNOSIS — M5136 Other intervertebral disc degeneration, lumbar region: Secondary | ICD-10-CM | POA: Diagnosis not present

## 2018-06-18 DIAGNOSIS — G8929 Other chronic pain: Secondary | ICD-10-CM | POA: Diagnosis present

## 2018-06-18 DIAGNOSIS — M4316 Spondylolisthesis, lumbar region: Secondary | ICD-10-CM | POA: Diagnosis not present

## 2018-06-18 DIAGNOSIS — M5442 Lumbago with sciatica, left side: Secondary | ICD-10-CM | POA: Diagnosis present

## 2018-06-18 DIAGNOSIS — M4807 Spinal stenosis, lumbosacral region: Secondary | ICD-10-CM | POA: Insufficient documentation

## 2018-06-18 DIAGNOSIS — M5441 Lumbago with sciatica, right side: Secondary | ICD-10-CM | POA: Diagnosis present

## 2018-06-18 DIAGNOSIS — M48061 Spinal stenosis, lumbar region without neurogenic claudication: Secondary | ICD-10-CM | POA: Diagnosis not present

## 2019-10-16 ENCOUNTER — Other Ambulatory Visit
Admission: RE | Admit: 2019-10-16 | Discharge: 2019-10-16 | Disposition: A | Discharge status: Home / Self Care | Payer: Medicare Other | Source: Ambulatory Visit | Attending: Specialist | Admitting: Specialist

## 2019-10-16 DIAGNOSIS — M79605 Pain in left leg: Secondary | ICD-10-CM | POA: Insufficient documentation

## 2019-10-16 DIAGNOSIS — R0600 Dyspnea, unspecified: Secondary | ICD-10-CM | POA: Insufficient documentation

## 2019-10-16 LAB — FIBRIN DERIVATIVES D-DIMER (ARMC ONLY): Fibrin derivatives D-dimer (ARMC): 1927.28 ng/mL (FEU) — ABNORMAL HIGH (ref 0.00–499.00)

## 2019-10-17 ENCOUNTER — Other Ambulatory Visit: Payer: Self-pay | Admitting: Family Medicine

## 2019-10-17 ENCOUNTER — Other Ambulatory Visit: Payer: Self-pay | Admitting: Specialist

## 2019-10-17 ENCOUNTER — Ambulatory Visit
Admission: RE | Admit: 2019-10-17 | Discharge: 2019-10-17 | Disposition: A | Discharge status: Home / Self Care | Payer: Medicare Other | Source: Ambulatory Visit | Attending: Specialist | Admitting: Specialist

## 2019-10-17 ENCOUNTER — Ambulatory Visit
Admission: RE | Admit: 2019-10-17 | Discharge: 2019-10-17 | Disposition: A | Discharge status: Home / Self Care | Payer: Medicare Other | Source: Ambulatory Visit | Attending: Family Medicine | Admitting: Family Medicine

## 2019-10-17 ENCOUNTER — Other Ambulatory Visit: Payer: Self-pay

## 2019-10-17 DIAGNOSIS — R7989 Other specified abnormal findings of blood chemistry: Secondary | ICD-10-CM

## 2019-10-17 HISTORY — DX: Type 2 diabetes mellitus without complications: E11.9

## 2019-10-17 MED ORDER — IOHEXOL 350 MG/ML SOLN
75.0000 mL | Freq: Once | INTRAVENOUS | Status: AC | PRN
  Administered 2019-10-17: 75 mL via INTRAVENOUS

## 2021-01-26 ENCOUNTER — Inpatient Hospital Stay
Admission: EM | Admit: 2021-01-26 | Discharge: 2021-01-28 | DRG: 392 | Disposition: A | Discharge status: Home / Self Care | Payer: Medicare Other | Attending: Family Medicine | Admitting: Family Medicine

## 2021-01-26 ENCOUNTER — Other Ambulatory Visit: Payer: Self-pay

## 2021-01-26 ENCOUNTER — Emergency Department: Payer: Medicare Other

## 2021-01-26 ENCOUNTER — Encounter: Payer: Self-pay | Admitting: Emergency Medicine

## 2021-01-26 DIAGNOSIS — E785 Hyperlipidemia, unspecified: Secondary | ICD-10-CM | POA: Diagnosis present

## 2021-01-26 DIAGNOSIS — Z7982 Long term (current) use of aspirin: Secondary | ICD-10-CM

## 2021-01-26 DIAGNOSIS — Z882 Allergy status to sulfonamides status: Secondary | ICD-10-CM

## 2021-01-26 DIAGNOSIS — Z87891 Personal history of nicotine dependence: Secondary | ICD-10-CM

## 2021-01-26 DIAGNOSIS — N390 Urinary tract infection, site not specified: Secondary | ICD-10-CM

## 2021-01-26 DIAGNOSIS — R1011 Right upper quadrant pain: Principal | ICD-10-CM

## 2021-01-26 DIAGNOSIS — Z9981 Dependence on supplemental oxygen: Secondary | ICD-10-CM

## 2021-01-26 DIAGNOSIS — Z9109 Other allergy status, other than to drugs and biological substances: Secondary | ICD-10-CM

## 2021-01-26 DIAGNOSIS — J9611 Chronic respiratory failure with hypoxia: Secondary | ICD-10-CM | POA: Diagnosis present

## 2021-01-26 DIAGNOSIS — D72829 Elevated white blood cell count, unspecified: Secondary | ICD-10-CM | POA: Diagnosis present

## 2021-01-26 DIAGNOSIS — Z79899 Other long term (current) drug therapy: Secondary | ICD-10-CM

## 2021-01-26 DIAGNOSIS — Z20822 Contact with and (suspected) exposure to covid-19: Secondary | ICD-10-CM | POA: Diagnosis present

## 2021-01-26 DIAGNOSIS — Z981 Arthrodesis status: Secondary | ICD-10-CM

## 2021-01-26 DIAGNOSIS — E119 Type 2 diabetes mellitus without complications: Secondary | ICD-10-CM | POA: Diagnosis present

## 2021-01-26 DIAGNOSIS — K449 Diaphragmatic hernia without obstruction or gangrene: Secondary | ICD-10-CM | POA: Diagnosis present

## 2021-01-26 DIAGNOSIS — I10 Essential (primary) hypertension: Secondary | ICD-10-CM | POA: Diagnosis present

## 2021-01-26 DIAGNOSIS — R3915 Urgency of urination: Secondary | ICD-10-CM | POA: Diagnosis present

## 2021-01-26 DIAGNOSIS — E039 Hypothyroidism, unspecified: Secondary | ICD-10-CM | POA: Diagnosis present

## 2021-01-26 DIAGNOSIS — Z888 Allergy status to other drugs, medicaments and biological substances status: Secondary | ICD-10-CM

## 2021-01-26 DIAGNOSIS — K8050 Calculus of bile duct without cholangitis or cholecystitis without obstruction: Secondary | ICD-10-CM

## 2021-01-26 DIAGNOSIS — Z79890 Hormone replacement therapy (postmenopausal): Secondary | ICD-10-CM

## 2021-01-26 DIAGNOSIS — I2510 Atherosclerotic heart disease of native coronary artery without angina pectoris: Secondary | ICD-10-CM | POA: Diagnosis present

## 2021-01-26 DIAGNOSIS — Z951 Presence of aortocoronary bypass graft: Secondary | ICD-10-CM

## 2021-01-26 DIAGNOSIS — Z885 Allergy status to narcotic agent status: Secondary | ICD-10-CM

## 2021-01-26 DIAGNOSIS — Z87440 Personal history of urinary (tract) infections: Secondary | ICD-10-CM

## 2021-01-26 LAB — CBC
HCT: 41.1 % (ref 36.0–46.0)
Hemoglobin: 12.7 g/dL (ref 12.0–15.0)
MCH: 27.7 pg (ref 26.0–34.0)
MCHC: 30.9 g/dL (ref 30.0–36.0)
MCV: 89.7 fL (ref 80.0–100.0)
Platelets: 245 10*3/uL (ref 150–400)
RBC: 4.58 MIL/uL (ref 3.87–5.11)
RDW: 13.7 % (ref 11.5–15.5)
WBC: 11.3 10*3/uL — ABNORMAL HIGH (ref 4.0–10.5)
nRBC: 0 % (ref 0.0–0.2)

## 2021-01-26 LAB — COMPREHENSIVE METABOLIC PANEL
ALT: 20 U/L (ref 0–44)
AST: 30 U/L (ref 15–41)
Albumin: 3.6 g/dL (ref 3.5–5.0)
Alkaline Phosphatase: 95 U/L (ref 38–126)
Anion gap: 9 (ref 5–15)
BUN: 17 mg/dL (ref 8–23)
CO2: 25 mmol/L (ref 22–32)
Calcium: 8.9 mg/dL (ref 8.9–10.3)
Chloride: 104 mmol/L (ref 98–111)
Creatinine, Ser: 1.02 mg/dL — ABNORMAL HIGH (ref 0.44–1.00)
GFR, Estimated: 54 mL/min — ABNORMAL LOW (ref >60)
Glucose, Bld: 205 mg/dL — ABNORMAL HIGH (ref 70–99)
Potassium: 4.3 mmol/L (ref 3.5–5.1)
Sodium: 138 mmol/L (ref 135–145)
Total Bilirubin: 0.8 mg/dL (ref 0.3–1.2)
Total Protein: 7.7 g/dL (ref 6.5–8.1)

## 2021-01-26 LAB — URINALYSIS, COMPLETE (UACMP) WITH MICROSCOPIC
Bilirubin Urine: NEGATIVE
Glucose, UA: 50 mg/dL — AB
Ketones, ur: NEGATIVE mg/dL
Nitrite: NEGATIVE
Protein, ur: 100 mg/dL — AB
Specific Gravity, Urine: 1.017 (ref 1.005–1.030)
WBC, UA: >50 WBC/hpf — ABNORMAL HIGH (ref 0–5)
pH: 5 (ref 5.0–8.0)

## 2021-01-26 LAB — LIPASE, BLOOD: Lipase: 36 U/L (ref 11–51)

## 2021-01-26 MED ORDER — DICYCLOMINE HCL 10 MG PO CAPS
10.0000 mg | ORAL_CAPSULE | Freq: Once | ORAL | Status: AC
  Administered 2021-01-26: 10 mg via ORAL
  Filled 2021-01-26: qty 1

## 2021-01-26 MED ORDER — DICYCLOMINE HCL 10 MG PO CAPS: 10.0000 mg | ORAL_CAPSULE | Freq: Three times a day (TID) | ORAL | 0 refills | Status: DC

## 2021-01-26 MED ORDER — CEFDINIR 300 MG PO CAPS
300.0000 mg | ORAL_CAPSULE | Freq: Two times a day (BID) | ORAL | Status: DC
  Administered 2021-01-26: 300 mg via ORAL
  Filled 2021-01-26 (×2): qty 1

## 2021-01-26 NOTE — ED Provider Notes (Signed)
  Patient had returning crampiness.  Additional Bentyl ordered.  The patient is still awaiting her HIDA scan.  Ongoing care assigned to Dr. Elesa Massed at this time to follow-up on HIDA scan results and reassessment   Sharyn Creamer, MD 01/26/21 2325

## 2021-01-26 NOTE — ED Provider Notes (Signed)
[facility] Emergency Department Provider Note   ____________________________________________   Event Date/Time   First MD Initiated Contact with Patient 01/26/21 1832     (approximate)  I have reviewed the triage vital signs and the nursing notes.   HISTORY  Chief Complaint Abdominal Pain    HPI Claire Cortez is a 87 y.o. female via the stated past medical history of hypothyroidism, CAD, and type 2 diabetes who presents for right upper quadrant abdominal pain that has been present for the last 48 hours.  Patient describes his pain as intermittent, sharp, nonradiating, 10/10, constant pain that states has no exacerbating or relieving factors.  Patient denies ever having similar symptoms in the past.  Incidentally, patient also complains of burning with urination for the past week that she states is a normal symptom of her urinary tract infections.  Patient currently denies any vision changes, tinnitus, difficulty speaking, facial droop, sore throat, chest pain, shortness of breath, nausea/vomiting/diarrhea, or weakness/numbness/paresthesias in any extremity         Past Medical History:  Diagnosis Date  . Acquired hypothyroidism 08/25/2015  . Arteriosclerosis of coronary artery 08/25/2015   Overview:  Sp cabg x3 with graft stent 2007   . Arthritis of knee, degenerative 04/10/2015  . Benign essential HTN 10/24/2014  . Borderline diabetes mellitus 08/25/2015  . Chronic cervical pain 08/25/2015  . Chronic diarrhea 09/24/2014  . Combined fat and carbohydrate induced hyperlipemia 10/24/2014  . Diabetes mellitus without complication [facility])     Patient Active Problem List   Diagnosis Date Noted  . Carotid stenosis 11/27/2017  . Atherosclerosis of native arteries of extremity with intermittent claudication (HCC) 10/22/2017  . Chronic venous insufficiency 10/22/2017  . Acquired hypothyroidism 08/25/2015  . Borderline diabetes mellitus 08/25/2015  .  Chronic cervical pain 08/25/2015  . Arteriosclerosis of coronary artery 08/25/2015  . Arthritis of knee, degenerative 04/10/2015  . Benign essential HTN 10/24/2014  . Combined fat and carbohydrate induced hyperlipemia 10/24/2014  . Chronic diarrhea 09/24/2014    Past Surgical History:  Procedure Laterality Date  . CERVICAL FUSION  1992  . CORONARY ARTERY BYPASS GRAFT     x3  . KNEE ARTHROSCOPY      Prior to Admission medications   Medication Sig Start Date End Date Taking? Authorizing Provider  amitriptyline (ELAVIL) 25 MG tablet Take by mouth. 01/21/15 01/21/16  [provider]  amitriptyline (ELAVIL) 25 MG tablet Take by mouth as needed. 01/21/15   [provider]  aspirin EC 81 MG tablet Take by mouth.    [provider]  CO ENZYME Q-10 PO Take 200 mg by mouth.    [provider]  conjugated estrogens (PREMARIN) vaginal cream Apply blueberry sized amount to vaginal opening with finger tip M-W-F nights. Patient not taking: Reported on 10/17/2017 08/26/15   Fernanda Drum, FNP  Glucosamine HCl (SM GLUCOSAMINE HCL) 1500 MG TABS Take by mouth.    [provider]  levothyroxine (SYNTHROID, LEVOTHROID) 88 MCG tablet TAKE 1 TABLET DAILY ON AN EMPTY STOMACH WITH A GLASS OF WATER AT LEAST 30 TO 60 MINUTES BEFORE BREAKFAST 07/01/15   [provider]  lisinopril (PRINIVIL,ZESTRIL) 10 MG tablet TAKE 1 TABLET DAILY 03/14/15   [provider]  Multiple Vitamin (MULTI-VITAMINS) TABS Take by mouth.    [provider]  Omega-3 Fatty Acids (FISH OIL) 1200 MG CAPS Take by mouth.    [provider]  OXYGEN Inhale 2 L into the lungs at  bedtime.    [provider]  PROAIR HFA 108 [phone] Base) MCG/ACT inhaler  09/11/17   [provider]  rosuvastatin (CRESTOR) 10 MG tablet TAKE 1 TABLET NIGHTLY 08/12/15   [provider]  solifenacin (VESICARE) 5 MG tablet Take 1 tablet (5 mg total) by mouth daily.  09/09/15   Fernanda Drum, FNP  traMADol (ULTRAM) 50 MG tablet Take by mouth. 06/18/15   [provider]    Allergies Demerol [meperidine], Emetrol, Lopressor  [metoprolol tartrate], Nsaids, and Sulfa antibiotics  Family History  Problem Relation Age of Onset  . Kidney cancer Neg Hx   . Kidney disease Neg Hx   . Prostate cancer Neg Hx     Social History Social History   Tobacco Use  . Smoking status: Former Smoker    Packs/day: 1.50    Years: 15.00    Pack years: 22.50    Types: Cigarettes    Quit date: 08/25/1974    Years since quitting: 46.4  . Smokeless tobacco: Never Used  . Tobacco comment: patient has not smoked since 1975  Substance Use Topics  . Alcohol use: Yes    Alcohol/week: 6.0 standard drinks    Types: 6 Glasses of wine per week  . Drug use: No    Review of Systems Constitutional: No fever/chills Eyes: No visual changes. ENT: No sore throat. Cardiovascular: Denies chest pain. Respiratory: Denies shortness of breath. Gastrointestinal: Endorses right upper quadrant abdominal pain.  No nausea, no vomiting.  No diarrhea. Genitourinary: Negative for dysuria. Musculoskeletal: Negative for acute arthralgias Skin: Negative for rash. Neurological: Negative for headaches, weakness/numbness/paresthesias in any extremity Psychiatric: Negative for suicidal ideation/homicidal ideation   ____________________________________________   PHYSICAL EXAM:  VITAL SIGNS: ED Triage Vitals  Enc Vitals Group     BP 01/26/21 1751 (!) 161/74     Pulse Rate 01/26/21 1751 (!) 104     Resp 01/26/21 1751 16     Temp 01/26/21 1751 98.9 F (37.2 C)     Temp Source 01/26/21 1751 Oral     SpO2 01/26/21 1751 97 %     Weight 01/26/21 1749 225 lb (102.1 kg)     Height 01/26/21 1749 5' 4.8" (1.646 m)     Head Circumference --      Peak Flow --      Pain Score 01/26/21 1748 10     Pain Loc --      Pain Edu? --      Excl. in GC? --    Constitutional: Alert and  oriented. Well appearing and in no acute distress. Eyes: Conjunctivae are normal. PERRL. Head: Atraumatic. Nose: No congestion/rhinnorhea. Mouth/Throat: Mucous membranes are moist. Neck: No stridor Cardiovascular: Grossly normal heart sounds.  Good peripheral circulation. Respiratory: Normal respiratory effort.  No retractions. Gastrointestinal: Soft and right upper quadrant tenderness palpation. No distention. Musculoskeletal: No obvious deformities Neurologic:  Normal speech and language. No gross focal neurologic deficits are appreciated. Skin:  Skin is warm and dry. No rash noted. Psychiatric: Mood and affect are normal. Speech and behavior are normal.  ____________________________________________   LABS (all labs ordered are listed, but only abnormal results are displayed)  Labs Reviewed  COMPREHENSIVE METABOLIC PANEL - Abnormal; Notable for the following components:      Result Value   Glucose, Bld 205 (*)    Creatinine, Ser 1.02 (*)    GFR, Estimated 54 (*)    All other components within normal limits  CBC - Abnormal; Notable for  the following components:   WBC 11.3 (*)    All other components within normal limits  URINALYSIS, COMPLETE (UACMP) WITH MICROSCOPIC - Abnormal; Notable for the following components:   Color, Urine YELLOW (*)    APPearance TURBID (*)    Glucose, UA 50 (*)    Hgb urine dipstick SMALL (*)    Protein, ur 100 (*)    Leukocytes,Ua MODERATE (*)    WBC, UA >50 (*)    Bacteria, UA MANY (*)    All other components within normal limits  LIPASE, BLOOD   RADIOLOGY  ED MD interpretation: Right upper quadrant ultrasound shows mild gallbladder wall thickening and recommend evaluation with HIDA scan  Official radiology report(s): US Abdomen Limited RUQ (LIVER/GB)  Result Date: 01/26/2021 CLINICAL DATA:  Three days of right upper quadrant pain. EXAM: ULTRASOUND ABDOMEN LIMITED RIGHT UPPER QUADRANT COMPARISON:  None. FINDINGS: Gallbladder: No gallstones,  biliary sludge or pericholecystic fluid visualized. Gallbladder is slightly decompressed with mild wall thickening measuring up to 3.6 mm. Positive sonographic Murphy sign noted by sonographer. Common bile duct: Diameter: 5 mm Liver: No focal lesion identified. Mildly increased parenchymal echogenicity. Portal vein is patent on color Doppler imaging with normal direction of blood flow towards the liver. Other: None. IMPRESSION: 1. No gallstones or biliary sludge visualized. Mild gallbladder wall thickening is nonspecific and can be seen in the setting of cholecystitis, liver disease, or heart failure. Consider further evaluation with nuclear medicine HIDA scan to assess cystic duct patency. 2. Mildly increased parenchymal echogenicity of the liver, nonspecific, but is most commonly seen with fatty infiltration. There are no focal liver lesions identified. Electronically Signed   By: Maudry Mayhew MD   On: 01/26/2021 20:03    ____________________________________________   PROCEDURES  Procedure(s) performed (including Critical Care):  Procedures   ____________________________________________   INITIAL IMPRESSION / ASSESSMENT AND PLAN / ED COURSE  As part of my medical decision making, I reviewed the following data within the electronic MEDICAL RECORD NUMBER Nursing notes reviewed and incorporated, Labs reviewed, EKG interpreted, Old chart reviewed, Radiograph reviewed and Notes from prior ED visits reviewed and incorporated     Presentation most consistent with biliary colic, without evidence of infection. History and exam AAA, pancreatitis, SBO, appendicitis, mesenteric ischemia, nephrolithiasis, pyelonephritis, or diverticulitis.  ED Interventions: analgesia PRN ED Workup: CBC, BMP, LFTs, Lipase, Abdominal US...HIDA  Disposition: Care of this patient will be signed out to the oncoming physician at the end of my shift.  All pertinent patient information conveyed and all questions answered.  All  further care and disposition decisions will be made by the oncoming physician.      ____________________________________________   FINAL CLINICAL IMPRESSION(S) / ED DIAGNOSES  Final diagnoses:  RUQ pain     ED Discharge Orders    None       Note:  This document was prepared using Dragon voice recognition software and may include unintentional dictation errors.   Merwyn Katos, MD 01/27/21 2035

## 2021-01-26 NOTE — ED Triage Notes (Signed)
C/O right sided abdominal  Pain since Saturday night.  Denies n/V/D.  States has overactive bladder and knows that she has a UTI now, with C/O burning with urination x 1 week.

## 2021-01-27 ENCOUNTER — Inpatient Hospital Stay: Payer: Medicare Other

## 2021-01-27 ENCOUNTER — Other Ambulatory Visit: Payer: Self-pay

## 2021-01-27 ENCOUNTER — Encounter: Payer: Self-pay | Admitting: Family Medicine

## 2021-01-27 DIAGNOSIS — E119 Type 2 diabetes mellitus without complications: Secondary | ICD-10-CM | POA: Diagnosis present

## 2021-01-27 DIAGNOSIS — Z79899 Other long term (current) drug therapy: Secondary | ICD-10-CM | POA: Diagnosis not present

## 2021-01-27 DIAGNOSIS — Z79890 Hormone replacement therapy (postmenopausal): Secondary | ICD-10-CM | POA: Diagnosis not present

## 2021-01-27 DIAGNOSIS — Z9981 Dependence on supplemental oxygen: Secondary | ICD-10-CM

## 2021-01-27 DIAGNOSIS — Z951 Presence of aortocoronary bypass graft: Secondary | ICD-10-CM | POA: Diagnosis not present

## 2021-01-27 DIAGNOSIS — I10 Essential (primary) hypertension: Secondary | ICD-10-CM

## 2021-01-27 DIAGNOSIS — Z885 Allergy status to narcotic agent status: Secondary | ICD-10-CM | POA: Diagnosis not present

## 2021-01-27 DIAGNOSIS — E039 Hypothyroidism, unspecified: Secondary | ICD-10-CM | POA: Diagnosis present

## 2021-01-27 DIAGNOSIS — Z981 Arthrodesis status: Secondary | ICD-10-CM | POA: Diagnosis not present

## 2021-01-27 DIAGNOSIS — R1011 Right upper quadrant pain: Principal | ICD-10-CM | POA: Diagnosis present

## 2021-01-27 DIAGNOSIS — Z7982 Long term (current) use of aspirin: Secondary | ICD-10-CM | POA: Diagnosis not present

## 2021-01-27 DIAGNOSIS — R933 Abnormal findings on diagnostic imaging of other parts of digestive tract: Secondary | ICD-10-CM | POA: Diagnosis not present

## 2021-01-27 DIAGNOSIS — Z9109 Other allergy status, other than to drugs and biological substances: Secondary | ICD-10-CM | POA: Diagnosis not present

## 2021-01-27 DIAGNOSIS — Z20822 Contact with and (suspected) exposure to covid-19: Secondary | ICD-10-CM | POA: Diagnosis present

## 2021-01-27 DIAGNOSIS — J9611 Chronic respiratory failure with hypoxia: Secondary | ICD-10-CM | POA: Diagnosis present

## 2021-01-27 DIAGNOSIS — N390 Urinary tract infection, site not specified: Secondary | ICD-10-CM

## 2021-01-27 DIAGNOSIS — Z87891 Personal history of nicotine dependence: Secondary | ICD-10-CM | POA: Diagnosis not present

## 2021-01-27 DIAGNOSIS — D72829 Elevated white blood cell count, unspecified: Secondary | ICD-10-CM | POA: Diagnosis present

## 2021-01-27 DIAGNOSIS — Z882 Allergy status to sulfonamides status: Secondary | ICD-10-CM | POA: Diagnosis not present

## 2021-01-27 DIAGNOSIS — Z87440 Personal history of urinary (tract) infections: Secondary | ICD-10-CM | POA: Diagnosis not present

## 2021-01-27 DIAGNOSIS — E785 Hyperlipidemia, unspecified: Secondary | ICD-10-CM | POA: Diagnosis present

## 2021-01-27 DIAGNOSIS — Z888 Allergy status to other drugs, medicaments and biological substances status: Secondary | ICD-10-CM | POA: Diagnosis not present

## 2021-01-27 DIAGNOSIS — I2510 Atherosclerotic heart disease of native coronary artery without angina pectoris: Secondary | ICD-10-CM | POA: Diagnosis present

## 2021-01-27 DIAGNOSIS — K449 Diaphragmatic hernia without obstruction or gangrene: Secondary | ICD-10-CM | POA: Diagnosis present

## 2021-01-27 DIAGNOSIS — R3915 Urgency of urination: Secondary | ICD-10-CM | POA: Diagnosis present

## 2021-01-27 LAB — RESP PANEL BY RT-PCR (FLU A&B, COVID) ARPGX2
Influenza A by PCR: NEGATIVE
Influenza B by PCR: NEGATIVE
SARS Coronavirus 2 by RT PCR: NEGATIVE

## 2021-01-27 LAB — COMPREHENSIVE METABOLIC PANEL
ALT: 17 U/L (ref 0–44)
AST: 23 U/L (ref 15–41)
Albumin: 3.4 g/dL — ABNORMAL LOW (ref 3.5–5.0)
Alkaline Phosphatase: 87 U/L (ref 38–126)
Anion gap: 8 (ref 5–15)
BUN: 14 mg/dL (ref 8–23)
CO2: 28 mmol/L (ref 22–32)
Calcium: 9.2 mg/dL (ref 8.9–10.3)
Chloride: 104 mmol/L (ref 98–111)
Creatinine, Ser: 1 mg/dL (ref 0.44–1.00)
GFR, Estimated: 55 mL/min — ABNORMAL LOW (ref >60)
Glucose, Bld: 154 mg/dL — ABNORMAL HIGH (ref 70–99)
Potassium: 4.7 mmol/L (ref 3.5–5.1)
Sodium: 140 mmol/L (ref 135–145)
Total Bilirubin: 0.7 mg/dL (ref 0.3–1.2)
Total Protein: 7.3 g/dL (ref 6.5–8.1)

## 2021-01-27 LAB — CBC
HCT: 39.7 % (ref 36.0–46.0)
Hemoglobin: 12.2 g/dL (ref 12.0–15.0)
MCH: 27.6 pg (ref 26.0–34.0)
MCHC: 30.7 g/dL (ref 30.0–36.0)
MCV: 89.8 fL (ref 80.0–100.0)
Platelets: 227 10*3/uL (ref 150–400)
RBC: 4.42 MIL/uL (ref 3.87–5.11)
RDW: 13.6 % (ref 11.5–15.5)
WBC: 14.4 10*3/uL — ABNORMAL HIGH (ref 4.0–10.5)
nRBC: 0 % (ref 0.0–0.2)

## 2021-01-27 LAB — HEMOGLOBIN A1C
Hgb A1c MFr Bld: 7.5 % — ABNORMAL HIGH (ref 4.8–5.6)
Mean Plasma Glucose: 168.55 mg/dL

## 2021-01-27 LAB — GLUCOSE, CAPILLARY
Glucose-Capillary: 138 mg/dL — ABNORMAL HIGH (ref 70–99)
Glucose-Capillary: 136 mg/dL — ABNORMAL HIGH (ref 70–99)
Glucose-Capillary: 146 mg/dL — ABNORMAL HIGH (ref 70–99)
Glucose-Capillary: 182 mg/dL — ABNORMAL HIGH (ref 70–99)

## 2021-01-27 MED ORDER — SODIUM CHLORIDE 0.9 % IV SOLN
1.0000 g | Freq: Once | INTRAVENOUS | Status: AC
  Administered 2021-01-27: 1 g via INTRAVENOUS
  Filled 2021-01-27: qty 1

## 2021-01-27 MED ORDER — MORPHINE SULFATE (PF) 2 MG/ML IV SOLN
2.0000 mg | INTRAVENOUS | Status: DC | PRN
  Administered 2021-01-27 – 2021-01-28 (×3): 2 mg via INTRAVENOUS
  Filled 2021-01-27 (×4): qty 1

## 2021-01-27 MED ORDER — ENOXAPARIN SODIUM 60 MG/0.6ML ~~LOC~~ SOLN
50.0000 mg | SUBCUTANEOUS | Status: DC
  Administered 2021-01-27 – 2021-01-28 (×2): 50 mg via SUBCUTANEOUS
  Filled 2021-01-27 (×2): qty 0.6

## 2021-01-27 MED ORDER — DARIFENACIN HYDROBROMIDE ER 7.5 MG PO TB24
7.5000 mg | ORAL_TABLET | Freq: Every day | ORAL | Status: DC
  Filled 2021-01-27 (×2): qty 1

## 2021-01-27 MED ORDER — ACETAMINOPHEN 650 MG RE SUPP: 650.0000 mg | Freq: Four times a day (QID) | RECTAL | Status: DC | PRN

## 2021-01-27 MED ORDER — INSULIN ASPART 100 UNIT/ML ~~LOC~~ SOLN
0.0000 [IU] | Freq: Three times a day (TID) | SUBCUTANEOUS | Status: DC
  Administered 2021-01-27: 1 [IU] via SUBCUTANEOUS

## 2021-01-27 MED ORDER — SODIUM CHLORIDE 0.9 % IV SOLN
1.0000 g | INTRAVENOUS | Status: DC
  Administered 2021-01-27: 05:00:00 1 g via INTRAVENOUS
  Filled 2021-01-27: qty 10

## 2021-01-27 MED ORDER — LEVOTHYROXINE SODIUM 88 MCG PO TABS
88.0000 ug | ORAL_TABLET | Freq: Every day | ORAL | Status: DC
  Filled 2021-01-27: qty 1

## 2021-01-27 MED ORDER — ONDANSETRON HCL 4 MG PO TABS: 4.0000 mg | ORAL_TABLET | Freq: Four times a day (QID) | ORAL | Status: DC | PRN

## 2021-01-27 MED ORDER — ONDANSETRON HCL 4 MG/2ML IJ SOLN: 4.0000 mg | Freq: Four times a day (QID) | INTRAMUSCULAR | Status: DC | PRN

## 2021-01-27 MED ORDER — ALBUTEROL SULFATE (2.5 MG/3ML) 0.083% IN NEBU: 2.5000 mg | INHALATION_SOLUTION | RESPIRATORY_TRACT | Status: DC | PRN

## 2021-01-27 MED ORDER — LISINOPRIL 10 MG PO TABS
10.0000 mg | ORAL_TABLET | Freq: Every day | ORAL | Status: DC
  Filled 2021-01-27: qty 1

## 2021-01-27 MED ORDER — SODIUM CHLORIDE 0.9 % IV SOLN: 1.0000 g | Freq: Once | INTRAVENOUS | Status: DC

## 2021-01-27 MED ORDER — SODIUM CHLORIDE 0.9 % IV SOLN: INTRAVENOUS | Status: DC

## 2021-01-27 MED ORDER — TRAZODONE HCL 50 MG PO TABS: 25.0000 mg | ORAL_TABLET | Freq: Every evening | ORAL | Status: DC | PRN

## 2021-01-27 MED ORDER — LOSARTAN POTASSIUM 25 MG PO TABS
25.0000 mg | ORAL_TABLET | Freq: Every day | ORAL | Status: DC
  Administered 2021-01-27 – 2021-01-28 (×2): 25 mg via ORAL
  Filled 2021-01-27 (×2): qty 1

## 2021-01-27 MED ORDER — MAGNESIUM HYDROXIDE 400 MG/5ML PO SUSP
30.0000 mL | Freq: Every day | ORAL | Status: DC | PRN
  Filled 2021-01-27: qty 30

## 2021-01-27 MED ORDER — ACETAMINOPHEN 325 MG PO TABS: 650.0000 mg | ORAL_TABLET | Freq: Four times a day (QID) | ORAL | Status: DC | PRN

## 2021-01-27 MED ORDER — ROSUVASTATIN CALCIUM 10 MG PO TABS
10.0000 mg | ORAL_TABLET | Freq: Every day | ORAL | Status: DC
  Administered 2021-01-27: 10 mg via ORAL
  Filled 2021-01-27: qty 1

## 2021-01-27 MED ORDER — TECHNETIUM TC 99M MEBROFENIN IV KIT
7.3000 | PACK | Freq: Once | INTRAVENOUS | Status: AC | PRN
  Administered 2021-01-27: 7.19 via INTRAVENOUS

## 2021-01-27 MED ORDER — PANTOPRAZOLE SODIUM 40 MG IV SOLR
40.0000 mg | Freq: Two times a day (BID) | INTRAVENOUS | Status: DC
  Administered 2021-01-27 – 2021-01-28 (×3): 40 mg via INTRAVENOUS
  Filled 2021-01-27 (×3): qty 40

## 2021-01-27 MED ORDER — OMEGA-3-ACID ETHYL ESTERS 1 G PO CAPS
1.0000 g | ORAL_CAPSULE | Freq: Every day | ORAL | Status: DC
  Administered 2021-01-27 – 2021-01-28 (×2): 1 g via ORAL
  Filled 2021-01-27 (×2): qty 1

## 2021-01-27 MED ORDER — SODIUM CHLORIDE 0.9 % IV SOLN
2.0000 g | INTRAVENOUS | Status: DC
  Administered 2021-01-28: 2 g via INTRAVENOUS
  Filled 2021-01-27 (×2): qty 20

## 2021-01-27 MED ORDER — LEVOTHYROXINE SODIUM 100 MCG PO TABS
100.0000 ug | ORAL_TABLET | Freq: Every day | ORAL | Status: DC
  Administered 2021-01-27 – 2021-01-28 (×2): 100 ug via ORAL
  Filled 2021-01-27 (×2): qty 1

## 2021-01-27 NOTE — Progress Notes (Signed)
87 year old with past medical history significant for hypothyroidism, hypertension, diabetes type 2 presents complaining of acute onset right upper quadrant abdominal pain for the last 2 or 3 days.  Denies nausea vomiting chills.  Denies chest pain.  She report dysuria and urinary frequency and urgency..  Evaluation in the ED she was tachycardic heart rate 104 97% on 2 L of oxygen, white blood cell 11.3, UA was positive for urinary tract infection.  Right upper quadrant ultrasound mild gallbladder wall thickening is nonspecific and can be seen in the setting of cholecystitis, liver disease and heart failure consider HIDA scan.  Surgery was consulted for further evaluation, patient was a started on IV antibiotics.   Subjective: Patient still complaining of right upper quadrant abdominal pain, no relation with meals. She still report dysuria.  Physical exam: General alert, obese in no acute distress 2 L of oxygen Lungs: Clear to auscultation Cardiovascular: S1-S2 regular rhythm or rate Abdomen: Bowel sounds present, soft, obese, right upper quadrant tenderness, lower quadrant tenderness, rigidity.  Assessment and plan:  1-Right upper quadrant abdominal pain: [place]; no gallstone or biliary sludge visualized.  Mild gallbladder wall thickening is nonspecific and can be seen in the setting of cholecystitis, liver disease or heart failure.  Increased parenchymal echogenicity of the liver. HIDA scan; ordered  and pending Surgery was consulted.  Start Protonix.   2-UTI;  UA ; 50 white blood cell. Continue with IV ceftriaxone. Follow urine culture.  Hypothyroidism:  Continue with Synthroid, home dose is 100 mcg updated orders.  HTN;  Continue with Cozaar,  Of note patient was not taking lisinopril due to adverse effect ( cough)  Dyslipidemia:  Continue with Lovaza  Chronic hypoxic respiratory failure: 2 L of oxygen at home .   Type 2 DM:  Continue with a sliding scale insulin DVT  prophylasix:

## 2021-01-27 NOTE — ED Provider Notes (Signed)
1:55 AM  Assumed care.  Patient is an 87 year old female who presents to the emergency department for 2 days of right upper quadrant abdominal pain.  No fevers, nausea, vomiting or diarrhea.  Also having dysuria for the past week.  Found to have a UTI.  I have added on a urine culture.  She received cefdinir.  Will start IV ceftriaxone.  Right upper quadrant abdominal ultrasound showed gallbladder wall thickening and HIDA scan was recommended for further evaluation.  HIDA scan ordered but not yet performed.  Charge nurse unable to get in touch with nuclear medicine technician on call.  I feel she will need admission to hospital for HIDA scan in the morning.  Given concerns for possible cholecystitis, will discuss with general surgery on-call.  We will keep her n.p.o. and start IV fluids.  She reports Bentyl has been controlling her pain.  2:05 AM  Spoke with Dr. Lady Gary with surgery.  She recommends admission to the hospitalist service, obtaining HIDA scan and surgery will see patient in consultation in the morning.  2:18 AM Discussed patient's case with hospitalist, Dr. Arville Care.  I have recommended admission and patient (and family if present) agree with this plan. Admitting physician will place admission orders.   I reviewed all nursing notes, vitals, pertinent previous records and reviewed/interpreted all EKGs, lab and urine results, imaging (as available).     Dhanush Jokerst, Layla Maw, DO 01/27/21 [phone]

## 2021-01-27 NOTE — Consult Note (Addendum)
[facility] SURGICAL CONSULTATION NOTE (initial) - cpt: 28786   HISTORY OF PRESENT ILLNESS (HPI):  87 y.o. female presented to [facility] ED yesterday for evaluation of abdominal pain. Patient reports a 2-3 day history of RUQ abdominal pain. This has been relatively constant and described as a sharp aching pain. This is worse with deep inspiration. No associated fever, chills, cough, CP, SOB, nausea, emesis, or bowel changes. She does feel that she had a similar episode maybe 1-2 months ago but this resolved spontaneously. No reported association with eating. No previous intra-abdominal surgeries reported. Of note, she does have a history of CABG around 15 years ago and is only on 81 mg ASA daily. She does use 2L home O2 at all times. She can preform all ADLs but gets SOB with walking up stairs. Work up in the ED revealed a leukocytosis to 11.3K (now 14.4K) and labs were otherwise unremarkable. She did have RUQ [place] which was concerning for possible acalculous cholecystitis. She is pending HIDA scan.  Surgery is consulted by emergency medicine physician Dr. Rochele Raring, DO in this context for evaluation and management of RUQ pain and possible acalculous cholecystitis.  PAST MEDICAL HISTORY (PMH):  Past Medical History:  Diagnosis Date   Acquired hypothyroidism 08/25/2015   Arteriosclerosis of coronary artery 08/25/2015   Overview:  Sp cabg x3 with graft stent 2007    Arthritis of knee, degenerative 04/10/2015   Benign essential HTN 10/24/2014   Borderline diabetes mellitus 08/25/2015   Chronic cervical pain 08/25/2015   Chronic diarrhea 09/24/2014   Combined fat and carbohydrate induced hyperlipemia 10/24/2014   Diabetes mellitus without complication (HCC)      PAST SURGICAL HISTORY (PSH):  Past Surgical History:  Procedure Laterality Date   CERVICAL FUSION  1992   CORONARY ARTERY BYPASS GRAFT     x3   KNEE ARTHROSCOPY       MEDICATIONS:  Prior to Admission medications    Medication Sig Start Date End Date Taking? Authorizing Provider  aspirin EC 81 MG tablet Take by mouth.   Yes [provider]  dicyclomine (BENTYL) 10 MG capsule Take 1 capsule (10 mg total) by mouth 4 (four) times daily -  before meals and at bedtime for 14 days. 01/26/21 02/09/21 Yes Merwyn Katos, MD  levothyroxine (SYNTHROID) 100 MCG tablet 100 mcg. 07/01/15  Yes [provider]  losartan (COZAAR) 50 MG tablet Take 50 mg by mouth at bedtime.   Yes [provider]  Multiple Vitamin (MULTI-VITAMINS) TABS Take by mouth.   Yes [provider]  Omega-3 Fatty Acids (FISH OIL) 1200 MG CAPS Take by mouth.   Yes [provider]  OXYGEN Inhale 2 L into the lungs at bedtime.   Yes [provider]  PROAIR HFA 108 [phone] Base) MCG/ACT inhaler  09/11/17  Yes [provider]  rosuvastatin (CRESTOR) 10 MG tablet TAKE 1 TABLET NIGHTLY 08/12/15  Yes [provider]  amitriptyline (ELAVIL) 25 MG tablet Take by mouth. 01/21/15 01/21/16  [provider]  amitriptyline (ELAVIL) 25 MG tablet Take by mouth as needed. Patient not taking: Reported on 01/27/2021 01/21/15   [provider]  CO ENZYME Q-10 PO Take 200 mg by mouth. Patient not taking: Reported on 01/27/2021    [provider]  conjugated estrogens (PREMARIN) vaginal cream Apply blueberry sized amount to vaginal opening with finger tip M-W-F nights. Patient not taking: No sig reported 08/26/15   Fernanda Drum, FNP  Glucosamine HCl  1500 MG TABS Take by mouth. Patient not taking: Reported on 01/27/2021    [provider]  lisinopril (PRINIVIL,ZESTRIL) 10 MG tablet TAKE 1 TABLET DAILY Patient not taking: Reported on 01/27/2021 03/14/15   [provider]  solifenacin (VESICARE) 5 MG tablet Take 1 tablet (5 mg total) by mouth daily. Patient not taking: Reported on 01/27/2021 09/09/15   Fernanda Drum, FNP  traMADol Janean Sark) 50 MG tablet Take by  mouth. Patient not taking: Reported on 01/27/2021 06/18/15   [provider]     ALLERGIES:  Allergies  Allergen Reactions   Demerol [Meperidine]    Emetrol Nausea And Vomiting   Lopressor  [Metoprolol Tartrate]     Other reaction(s): Unknown intolerance   Nsaids     Other reaction(s): Other (See Comments) Causes elevation in BP   Sulfa Antibiotics Rash     SOCIAL HISTORY:  Social History   Socioeconomic History   Marital status: Widowed    Spouse name: Not on file   Number of children: Not on file   Years of education: Not on file   Highest education level: Not on file  Occupational History   Not on file  Tobacco Use   Smoking status: Former Smoker    Packs/day: 1.50    Years: 15.00    Pack years: 22.50    Types: Cigarettes    Quit date: 08/25/1974    Years since quitting: 46.4   Smokeless tobacco: Never Used   Tobacco comment: patient has not smoked since 1975  Substance and Sexual Activity   Alcohol use: Yes    Alcohol/week: 6.0 standard drinks    Types: 6 Glasses of wine per week   Drug use: No   Sexual activity: Not on file  Other Topics Concern   Not on file  Social History Narrative   Not on file   Social Determinants of Health   Financial Resource Strain: Not on file  Food Insecurity: Not on file  Transportation Needs: Not on file  Physical Activity: Not on file  Stress: Not on file  Social Connections: Not on file  Intimate Partner Violence: Not on file     FAMILY HISTORY:  Family History  Problem Relation Age of Onset   Kidney cancer Neg Hx    Kidney disease Neg Hx    Prostate cancer Neg Hx       REVIEW OF SYSTEMS:  Review of Systems  Constitutional: Negative for chills and fever.  Respiratory: Negative for cough and shortness of breath.   Cardiovascular: Negative for chest pain and palpitations.  Gastrointestinal: Positive for abdominal pain. Negative for diarrhea, nausea and vomiting.  Genitourinary: Positive for dysuria.  Negative for urgency.  All other systems reviewed and are negative.   VITAL SIGNS:  Temp:  [98.5 F (36.9 C)-98.9 F (37.2 C)] 98.5 F (36.9 C) (03/29 0359) Pulse Rate:  [86-104] 103 (03/29 0359) Resp:  [16-20] 16 (03/28 2319) BP: (132-161)/(60-90) 158/87 (03/29 0359) SpO2:  [92 %-99 %] 98 % (03/29 0359) Weight:  [102.1 kg] 102.1 kg (03/28 1749)     Height: 5' 4.8" (164.6 cm) Weight: 102.1 kg BMI (Calculated): 37.67   INTAKE/OUTPUT:  03/28 0701 - 03/29 0700 In: 175.7 [I.V.:75.7; IV Piggyback:100] Out: -   PHYSICAL EXAM:  Physical Exam Vitals and nursing note reviewed. Exam conducted with a chaperone present.  Constitutional:      General: She is not in acute distress.    Appearance: She is well-developed. She is obese.  She is not ill-appearing.     Comments: Patient resting in bed, daughter at bedside  HENT:     Head: Normocephalic and atraumatic.  Cardiovascular:     Rate and Rhythm: Normal rate and regular rhythm.     Heart sounds: Normal heart sounds. No murmur heard.   Pulmonary:     Effort: Pulmonary effort is normal. No respiratory distress.     Breath sounds: Normal breath sounds.  Abdominal:     General: There is no distension.     Palpations: Abdomen is soft.     Tenderness: There is abdominal tenderness in the right upper quadrant and epigastric area. There is no guarding or rebound. Positive signs include Murphy's sign.     Comments: Abdomen is obese, soft, tenderness in the epigastrium and rUQ, worse with deep inspiration, non-distended, no rebound/guarding. Positive Murphy's Sign.   Genitourinary:    Comments: deferred Skin:    General: Skin is warm and dry.     Coloration: Skin is not jaundiced or pale.  Neurological:     General: No focal deficit present.     Mental Status: She is alert and oriented to person, place, and time.  Psychiatric:        Mood and Affect: Mood normal.        Behavior: Behavior normal.      Labs:  CBC Latest Ref Rng &  Units 01/27/2021 01/26/2021 10/19/2014  WBC 4.0 - 10.5 K/uL 14.4(H) 11.3(H) 10.1  Hemoglobin 12.0 - 15.0 g/dL 29.512.2 18.812.7 41.612.7  Hematocrit 36.0 - 46.0 % 39.7 41.1 40.2  Platelets 150 - 400 K/uL 227 245 221   CMP Latest Ref Rng & Units 01/27/2021 01/26/2021 10/19/2014  Glucose 70 - 99 mg/dL 606(T154(H) 016(W205(H) 91  BUN 8 - 23 mg/dL 14 17 18   Creatinine 0.44 - 1.00 mg/dL 1.091.00 3.23(F1.02(H) 5.731.29  Sodium 135 - 145 mmol/L 140 138 142  Potassium 3.5 - 5.1 mmol/L 4.7 4.3 4.1  Chloride 98 - 111 mmol/L 104 104 109(H)  CO2 22 - 32 mmol/L 28 25 25   Calcium 8.9 - 10.3 mg/dL 9.2 8.9 8.6  Total Protein 6.5 - 8.1 g/dL 7.3 7.7 7.6  Total Bilirubin 0.3 - 1.2 mg/dL 0.7 0.8 0.5  Alkaline Phos 38 - 126 U/L 87 95 69  AST 15 - 41 U/L 23 30 37  ALT 0 - 44 U/L 17 20 34     Imaging studies:   RUQ US (01/26/2021) personally reviewed showing mild thickening of the gallbladder but no obvious stones, and radiologist report reviewed below:  IMPRESSION: 1. No gallstones or biliary sludge visualized. Mild gallbladder wall thickening is nonspecific and can be seen in the setting of cholecystitis, liver disease, or heart failure. Consider further evaluation with nuclear medicine HIDA scan to assess cystic duct patency. 2. Mildly increased parenchymal echogenicity of the liver, nonspecific, but is most commonly seen with fatty infiltration. There are no focal liver lesions identified.  Assessment/Plan: (ICD-10's: R10.11) 87 y.o. female with RUQ abdominal pain found to have gallbladder wall thickening without cholelithiasis overall concerning for acalculous cholecystitis pending HIDA, complicated by pertinent comorbidities including history of CABG and need for home O2.   - I do think HIDA scan is reasonable, scheduled for 2PM. My suspicion is this will be positive based on her physical examination. If this is positive, I do feel that she would be best suited for temporizing percutaneous cholecystostomy tube placement given her  history of CABG and need for home O2.  This will allow for outpatient optimization as an outpatient and interval cholecystectomy. I did discuss my thought process and rationale with patient and daughter at bedside who are in agreement.     - NPO for now (okay sips with meds)   - IVF support  - Continue IV Abx (ceftriaxone)  - Monitor abdominal examination    - Pain control prn; antiemetics prn   - Monitor leukocytosis  - further management per primary service; we will follow   All of the above findings and recommendations were discussed with the patient and her daughter at bedside, and all of their questions were answered to their  expressed satisfaction.  Thank you for the opportunity to participate in this patient's care.   -- Lynden Oxford, PA-C [facility] 01/27/2021, 7:14 AM [phone] M-F: 7am - 4pm  HIDA scan negative for cholecystitis.  Suspect possible musculoskeletal etiology of pain.   I saw and evaluated the patient.  I agree with the above documentation, exam, and plan, which I have edited where appropriate. Duanne Guess  12:30 PM

## 2021-01-27 NOTE — H&P (Addendum)
[place]   PATIENT NAME: Claire Cortez    MR#:  [id number]  DATE OF BIRTH:  October 08, 1934  DATE OF ADMISSION:  01/26/2021  PRIMARY CARE PHYSICIAN: Marisue Ivan, MD   Patient is coming from: Home  REQUESTING/REFERRING PHYSICIAN: Ward, Layla Maw, DO CHIEF COMPLAINT:   Chief Complaint  Patient presents with  . Abdominal Pain    HISTORY OF PRESENT ILLNESS:  Claire Cortez is a 87 y.o. Caucasian female with medical history significant for hypothyroidism, essential hypertension and type 2 diabetes mellitus, presented to the emergency room with acute onset of right upper quadrant abdominal pain which has been going on over the last 2 to 3 days without nausea or vomiting or diarrhea.  She denies any fever or chills.  No chest pain or palpitations or cough or wheezing.  She admitted to dysuria as well as urinary frequency and urgency.    ED Course: When she came to the ER blood pressure was 161/74 with a heart rate of 104 with pulse 7097% on 2 L of O2 by nasal cannula.  Labs revealed blood glucose of 205 with otherwise unremarkable CMP.  CBC showed leukocytosis 11.3.  Respiratory panel is currently pending.  Urinalysis was positive for UTI.  Urine culture was sent. EKG is currently pending.   Imaging: Right upper quadrant ultrasound revealed the following: 1. No gallstones or biliary sludge visualized. Mild gallbladder wall thickening is nonspecific and can be seen in the setting of cholecystitis, liver disease, or heart failure. Consider further evaluation with nuclear medicine HIDA scan to assess cystic duct patency. 2. Mildly increased parenchymal echogenicity of the liver, nonspecific, but is most commonly seen with fatty infiltration. There are no focal liver lesions identified.  The patient was given a gram of IV Rocephin and 10 mg of p.o. Bentyl.  Dr. Lady Gary was notified about the patient and is aware.  She will be admitted to a medical monitored bed for further  evaluation and management PAST MEDICAL HISTORY:   Past Medical History:  Diagnosis Date  . Acquired hypothyroidism 08/25/2015  . Arteriosclerosis of coronary artery 08/25/2015   Overview:  Sp cabg x3 with graft stent 2007   . Arthritis of knee, degenerative 04/10/2015  . Benign essential HTN 10/24/2014  . Borderline diabetes mellitus 08/25/2015  . Chronic cervical pain 08/25/2015  . Chronic diarrhea 09/24/2014  . Combined fat and carbohydrate induced hyperlipemia 10/24/2014  . Diabetes mellitus without complication (HCC)     PAST SURGICAL HISTORY:   Past Surgical History:  Procedure Laterality Date  . CERVICAL FUSION  1992  . CORONARY ARTERY BYPASS GRAFT     x3  . KNEE ARTHROSCOPY      SOCIAL HISTORY:   Social History   Tobacco Use  . Smoking status: Former Smoker    Packs/day: 1.50    Years: 15.00    Pack years: 22.50    Types: Cigarettes    Quit date: 08/25/1974    Years since quitting: 46.4  . Smokeless tobacco: Never Used  . Tobacco comment: patient has not smoked since 1975  Substance Use Topics  . Alcohol use: Yes    Alcohol/week: 6.0 standard drinks    Types: 6 Glasses of wine per week    FAMILY HISTORY:   Family History  Problem Relation Age of Onset  . Kidney cancer Neg Hx   . Kidney disease Neg Hx   . Prostate cancer Neg Hx     DRUG ALLERGIES:  Allergies  Allergen Reactions  . Demerol [Meperidine]   . Emetrol Nausea And Vomiting  . Lopressor  [Metoprolol Tartrate]     Other reaction(s): Unknown intolerance  . Nsaids     Other reaction(s): Other (See Comments) Causes elevation in BP  . Sulfa Antibiotics Rash    REVIEW OF SYSTEMS:   ROS As per history of present illness. All pertinent systems were reviewed above. Constitutional, HEENT, cardiovascular, respiratory, GI, GU, musculoskeletal, neuro, psychiatric, endocrine, integumentary and hematologic systems were reviewed and are otherwise negative/unremarkable except for positive  findings mentioned above in the HPI.   MEDICATIONS AT HOME:   Prior to Admission medications   Medication Sig Start Date End Date Taking? Authorizing Provider  dicyclomine (BENTYL) 10 MG capsule Take 1 capsule (10 mg total) by mouth 4 (four) times daily -  before meals and at bedtime for 14 days. 01/26/21 02/09/21 Yes Merwyn Katos, MD  amitriptyline (ELAVIL) 25 MG tablet Take by mouth. 01/21/15 01/21/16  [provider]  amitriptyline (ELAVIL) 25 MG tablet Take by mouth as needed. 01/21/15   [provider]  aspirin EC 81 MG tablet Take by mouth.    [provider]  CO ENZYME Q-10 PO Take 200 mg by mouth.    [provider]  conjugated estrogens (PREMARIN) vaginal cream Apply blueberry sized amount to vaginal opening with finger tip M-W-F nights. Patient not taking: Reported on 10/17/2017 08/26/15   Fernanda Drum, FNP  Glucosamine HCl (SM GLUCOSAMINE HCL) 1500 MG TABS Take by mouth.    [provider]  levothyroxine (SYNTHROID, LEVOTHROID) 88 MCG tablet TAKE 1 TABLET DAILY ON AN EMPTY STOMACH WITH A GLASS OF WATER AT LEAST 30 TO 60 MINUTES BEFORE BREAKFAST 07/01/15   [provider]  lisinopril (PRINIVIL,ZESTRIL) 10 MG tablet TAKE 1 TABLET DAILY 03/14/15   [provider]  Multiple Vitamin (MULTI-VITAMINS) TABS Take by mouth.    [provider]  Omega-3 Fatty Acids (FISH OIL) 1200 MG CAPS Take by mouth.    [provider]  OXYGEN Inhale 2 L into the lungs at bedtime.    [provider]  PROAIR HFA 108 [phone] Base) MCG/ACT inhaler  09/11/17   [provider]  rosuvastatin (CRESTOR) 10 MG tablet TAKE 1 TABLET NIGHTLY 08/12/15   [provider]  solifenacin (VESICARE) 5 MG tablet Take 1 tablet (5 mg total) by mouth daily. 09/09/15   Fernanda Drum, FNP  traMADol (ULTRAM) 50 MG tablet Take by mouth. 06/18/15   [provider]      VITAL SIGNS:  Blood pressure (!) 156/60, pulse  86, temperature 98.9 F (37.2 C), temperature source Oral, resp. rate 16, height 5' 4.8" (1.646 m), weight 102.1 kg, SpO2 98 %.  PHYSICAL EXAMINATION:  Physical Exam  GENERAL:  87 y.o.-year-old Caucasian female patient lying in the bed with no acute distress.  EYES: Pupils equal, round, reactive to light and accommodation. No scleral icterus. Extraocular muscles intact.  HEENT: Head atraumatic, normocephalic. Oropharynx and nasopharynx clear.  NECK:  Supple, no jugular venous distention. No thyroid enlargement, no tenderness.  LUNGS: Normal breath sounds bilaterally, no wheezing, rales,rhonchi or crepitation. No use of accessory muscles of respiration.  CARDIOVASCULAR: Regular rate and rhythm, S1, S2 normal. No murmurs, rubs, or gallops.  ABDOMEN: Soft, nondistended with right upper quadrant tenderness with a rebound tenderness guarding or rigidity.  She had positive Murphy sign. Bowel sounds present. No organomegaly or mass.  EXTREMITIES: No pedal edema, cyanosis,  or clubbing.  NEUROLOGIC: Cranial nerves II through XII are intact. Muscle strength 5/5 in all extremities. Sensation intact. Gait not checked.  PSYCHIATRIC: The patient is alert and oriented x 3.  Normal affect and good eye contact. SKIN: No obvious rash, lesion, or ulcer.   LABORATORY PANEL:   CBC Recent Labs  Lab 01/26/21 1757  WBC 11.3*  HGB 12.7  HCT 41.1  PLT 245   ------------------------------------------------------------------------------------------------------------------  Chemistries  Recent Labs  Lab 01/26/21 1757  NA 138  K 4.3  CL 104  CO2 25  GLUCOSE 205*  BUN 17  CREATININE 1.02*  CALCIUM 8.9  AST 30  ALT 20  ALKPHOS 95  BILITOT 0.8   ------------------------------------------------------------------------------------------------------------------  Cardiac Enzymes No results for input(s): TROPONINI in the last 168  hours. ------------------------------------------------------------------------------------------------------------------  RADIOLOGY:  US Abdomen Limited RUQ (LIVER/GB)  Result Date: 01/26/2021 CLINICAL DATA:  Three days of right upper quadrant pain. EXAM: ULTRASOUND ABDOMEN LIMITED RIGHT UPPER QUADRANT COMPARISON:  None. FINDINGS: Gallbladder: No gallstones, biliary sludge or pericholecystic fluid visualized. Gallbladder is slightly decompressed with mild wall thickening measuring up to 3.6 mm. Positive sonographic Murphy sign noted by sonographer. Common bile duct: Diameter: 5 mm Liver: No focal lesion identified. Mildly increased parenchymal echogenicity. Portal vein is patent on color Doppler imaging with normal direction of blood flow towards the liver. Other: None. IMPRESSION: 1. No gallstones or biliary sludge visualized. Mild gallbladder wall thickening is nonspecific and can be seen in the setting of cholecystitis, liver disease, or heart failure. Consider further evaluation with nuclear medicine HIDA scan to assess cystic duct patency. 2. Mildly increased parenchymal echogenicity of the liver, nonspecific, but is most commonly seen with fatty infiltration. There are no focal liver lesions identified. Electronically Signed   By: Maudry MayhewJeffrey  Waltz MD   On: 01/26/2021 20:03      IMPRESSION AND PLAN:  Active Problems:   Right upper quadrant abdominal pain  1.  Right upper quad abdominal pain concerning for acute cholecystitis with diagnostic ultrasound. -The patient will be admitted to a medical monitored bed. -We will obtain a HIDA scan. -General surgery consult will be obtained. -Dr. Lady Garyannon was notified and is aware about the patient. -Pain management will be provided. -We will keep her n.p.o. -She will be hydrated with IV normal saline.  2.  UTI. -We will continue her on IV heparin follow urine culture and sensitivity.  3.  Hypothyroidism. -We will continue Synthroid and check TSH  level.  4.  Essential hypertension. -We will continue her antihypertensive therapy.  5.  Dyslipidemia. -We will continue statin therapy and fish oil  6.  Type 2 diabetes mellitus. -The patient will be placed on supplement coverage with NovoLog. . DVT prophylaxis: Lovenox. Code Status: full code. Family Communication:  The plan of care was discussed in details with the patient who did not request for any family members to be notified at this time.  I answered all questions. The patient agreed to proceed with the above mentioned plan. Further management will depend upon hospital course. Disposition Plan: Back to previous home environment Consults called: General surgery. All the records are reviewed and case discussed with ED provider.  Status is: Inpatient  Remains inpatient appropriate because:Ongoing active pain requiring inpatient pain management, Ongoing diagnostic testing needed not appropriate for outpatient work up, Unsafe d/c plan, IV treatments appropriate due to intensity of illness or inability to take PO and Inpatient level of care appropriate due to severity of illness   Dispo: The  patient is from: Home              Anticipated d/c is to: Home              Patient currently is not medically stable to d/c.   Difficult to place patient No   TOTAL TIME TAKING CARE OF THIS PATIENT: 55 minutes.    Hannah Beat M.D on 01/27/2021 at 3:12 AM  [facility]   From 7 PM-7 AM, contact night-coverage [email or web address]  CC: Primary care physician; Marisue Ivan, MD

## 2021-01-27 NOTE — Plan of Care (Signed)

## 2021-01-27 NOTE — Progress Notes (Signed)
PHARMACY NOTE:  ANTIMICROBIAL DOSAGE ADJUSTMENT  Current antimicrobial regimen includes a mismatch between antimicrobial dosage and indication.  As per policy approved by the Pharmacy & Therapeutics and Medical Executive Committees, the antimicrobial dosage will be adjusted accordingly.  Current antimicrobial dosage:  Ceftriaxone 1 gram IV q24h  Indication:  UTI and now Intra-Abdominal Infection possible (Cholecysitis)  Antimicrobial dosage has been changed to:  Ceftriaxone 2 gm IV q24h to include IAI coverage  Additional comments:   Thank you for allowing pharmacy to be a part of this patient's care.  Bari Mantis PharmD Clinical Pharmacist 01/27/2021

## 2021-01-27 NOTE — Progress Notes (Signed)
Contacted by EDP, Kristen Ward, DO, regarding this 87 y/o F who presented to the ED with right-sided abdominal pain x 2-3 days and history of multiple UTIs.  UA in the ED was consistent with a UTI. Mild leukocytosis, otherwise labs essentially WNL.  RUQ U/S equivocal and HIDA scan advised by radiology.  Unable to obtain HIDA overnight.   Recommend: --admit to medicine --NPO --IVF --tx UTI (Rocephin per EDP) --HIDA scan when available --General Surgery to follow up results of HIDA and if positive, discuss intervention options.  Full consult note to follow in AM.

## 2021-01-27 NOTE — Progress Notes (Signed)
Anticoagulation monitoring(Lovenox):  87 yo female ordered Lovenox 40 mg Q24h  Filed Weights   01/26/21 1749  Weight: 102.1 kg (225 lb)   BMI 37   Lab Results  Component Value Date   CREATININE 1.02 (H) 01/26/2021   CREATININE 1.29 10/19/2014   Estimated Creatinine Clearance: 47.6 mL/min (A) (by C-G formula based on SCr of 1.02 mg/dL (H)). Hemoglobin & Hematocrit     Component Value Date/Time   HGB 12.7 01/26/2021 1757   HGB 12.7 10/19/2014 1453   HCT 41.1 01/26/2021 1757   HCT 40.2 10/19/2014 1453     Per Protocol for Patient with estCrcl > 30 ml/min and BMI > 30, will transition to Lovenox 50 mg Q24h.

## 2021-01-27 NOTE — ED Notes (Signed)
Pt admitted to 122 via wheelchair.

## 2021-01-28 ENCOUNTER — Inpatient Hospital Stay: Payer: Medicare Other

## 2021-01-28 LAB — CBC
HCT: 39.5 % (ref 36.0–46.0)
Hemoglobin: 12 g/dL (ref 12.0–15.0)
MCH: 27.8 pg (ref 26.0–34.0)
MCHC: 30.4 g/dL (ref 30.0–36.0)
MCV: 91.4 fL (ref 80.0–100.0)
Platelets: 217 10*3/uL (ref 150–400)
RBC: 4.32 MIL/uL (ref 3.87–5.11)
RDW: 13.8 % (ref 11.5–15.5)
WBC: 10.4 10*3/uL (ref 4.0–10.5)
nRBC: 0 % (ref 0.0–0.2)

## 2021-01-28 LAB — COMPREHENSIVE METABOLIC PANEL
ALT: 13 U/L (ref 0–44)
AST: 21 U/L (ref 15–41)
Albumin: 3.1 g/dL — ABNORMAL LOW (ref 3.5–5.0)
Alkaline Phosphatase: 79 U/L (ref 38–126)
Anion gap: 8 (ref 5–15)
BUN: 10 mg/dL (ref 8–23)
CO2: 27 mmol/L (ref 22–32)
Calcium: 8.6 mg/dL — ABNORMAL LOW (ref 8.9–10.3)
Chloride: 104 mmol/L (ref 98–111)
Creatinine, Ser: 0.92 mg/dL (ref 0.44–1.00)
GFR, Estimated: >60 mL/min (ref >60)
Glucose, Bld: 131 mg/dL — ABNORMAL HIGH (ref 70–99)
Potassium: 4.2 mmol/L (ref 3.5–5.1)
Sodium: 139 mmol/L (ref 135–145)
Total Bilirubin: 0.6 mg/dL (ref 0.3–1.2)
Total Protein: 6.9 g/dL (ref 6.5–8.1)

## 2021-01-28 LAB — URINE CULTURE

## 2021-01-28 LAB — GLUCOSE, CAPILLARY
Glucose-Capillary: 112 mg/dL — ABNORMAL HIGH (ref 70–99)
Glucose-Capillary: 139 mg/dL — ABNORMAL HIGH (ref 70–99)

## 2021-01-28 MED ORDER — SIMETHICONE 80 MG PO CHEW
80.0000 mg | CHEWABLE_TABLET | Freq: Once | ORAL | Status: AC
  Administered 2021-01-28: 14:00:00 80 mg via ORAL
  Filled 2021-01-28: qty 1

## 2021-01-28 MED ORDER — SIMETHICONE 40 MG/0.6ML PO SUSP
80.0000 mg | Freq: Once | ORAL | Status: DC
  Filled 2021-01-28: qty 1.2

## 2021-01-28 MED ORDER — SIMETHICONE 80 MG PO CHEW
80.0000 mg | CHEWABLE_TABLET | Freq: Four times a day (QID) | ORAL | Status: DC | PRN
  Filled 2021-01-28: qty 1

## 2021-01-28 MED ORDER — CYCLOBENZAPRINE HCL 10 MG PO TABS
5.0000 mg | ORAL_TABLET | Freq: Three times a day (TID) | ORAL | Status: DC
  Administered 2021-01-28: 12:00:00 5 mg via ORAL
  Filled 2021-01-28: qty 1

## 2021-01-28 MED ORDER — IOHEXOL 350 MG/ML SOLN
75.0000 mL | Freq: Once | INTRAVENOUS | Status: AC | PRN
  Administered 2021-01-28: 75 mL via INTRAVENOUS

## 2021-01-28 MED ORDER — PANTOPRAZOLE SODIUM 40 MG PO TBEC: 40.0000 mg | DELAYED_RELEASE_TABLET | Freq: Every day | ORAL | 3 refills | Status: AC

## 2021-01-28 NOTE — Discharge Summary (Signed)
Physician Discharge Summary  Claire Cortez RFF:[id number] DOB: 07/06/1934 DOA: 01/26/2021  PCP: Marisue Ivan, MD  Admit date: 01/26/2021 Discharge date: 01/28/2021  Time spent: 50 minutes  Recommendations for Outpatient Follow-up:  1. Follow-up PCP in 2 weeks 2. Follow-up general surgery as needed   Discharge Diagnoses:  Active Problems:   Right upper quadrant abdominal pain   Discharge Condition: Stable  Diet recommendation: Soft diet  Filed Weights   01/26/21 1749  Weight: 102.1 kg    History of present illness:  87 year old female with medical history of hypothyroidism, hypertension, diabetes mellitus type 2 came to ED with complaints of acute onset right upper quadrant abdominal pain for past 2 to 3 days.  No nausea or vomiting.  Right upper quadrant abdominal ultrasound showed mild gallbladder wall thickening which was nonspecific.  General surgery was consulted, patient underwent HIDA scan which was unremarkable.  Hospital Course:   Right upper quadrant pain-patient was tender to palpation right lower chest wall, CT chest was obtained to rule out pulmonary embolism which was negative for PE.  It showed mild sliding hiatal hernia.  Patient has been started on Protonix 40 mg p.o. daily.  She can follow-up with general surgery as needed for further evaluation of hiatal hernia as outpatient. General surgery has signed off.  Abnormal UA-urine culture grew multiple species.  Will discontinue IV ceftriaxone.  Hypothyroidism-continue Synthroid  Hypertension-continue Cozaar  Dyslipidemia-continue Lovaza  Procedures:    Consultations:    Discharge Exam: Vitals:   01/28/21 0411 01/28/21 0726  BP: (!) 156/89 (!) 116/56  Pulse: 77 70  Resp: 16 17  Temp: 98.6 F (37 C) 98.1 F (36.7 C)  SpO2: 94% 99%    General: Appears in no acute distress Cardiovascular: S1-S2, regular Respiratory: Clear to auscultation bilaterally  Discharge  Instructions   Discharge Instructions    Diet - low sodium heart healthy   Complete by: As directed    Increase activity slowly   Complete by: As directed      Allergies as of 01/28/2021      Reactions   Demerol [meperidine]    Emetrol Nausea And Vomiting   Lopressor  [metoprolol Tartrate]    Other reaction(s): Unknown intolerance   Nsaids    Other reaction(s): Other (See Comments) Causes elevation in BP   Sulfa Antibiotics Rash      Medication List    STOP taking these medications   amitriptyline 25 MG tablet Commonly known as: ELAVIL   lisinopril 10 MG tablet Commonly known as: ZESTRIL     TAKE these medications   acetaminophen 500 MG tablet Commonly known as: TYLENOL Take by mouth.   alendronate 70 MG tablet Commonly known as: FOSAMAX Take by mouth.   aspirin EC 81 MG tablet Take by mouth.   Calcium Carbonate-Vitamin D 600-400 MG-UNIT tablet Take 2 tablets by mouth daily.   cetirizine 10 MG tablet Commonly known as: ZYRTEC Take by mouth.   CO ENZYME Q-10 PO Take 200 mg by mouth.   conjugated estrogens vaginal cream Commonly known as: Premarin Apply blueberry sized amount to vaginal opening with finger tip M-W-F nights.   DULoxetine 30 MG capsule Commonly known as: CYMBALTA   Fish Oil 1200 MG Caps Take by mouth.   Fluticasone-Salmeterol 250-50 MCG/DOSE Aepb Commonly known as: ADVAIR Inhale into the lungs.   Glucosamine HCl 1500 MG Tabs Take by mouth.   levothyroxine 100 MCG tablet Commonly known as: SYNTHROID 100 mcg.   losartan 50 MG tablet  Commonly known as: COZAAR Take 50 mg by mouth at bedtime.   Multi-Vitamins Tabs Take by mouth.   OXYGEN Inhale 2 L into the lungs at bedtime.   pantoprazole 40 MG tablet Commonly known as: Protonix Take 1 tablet (40 mg total) by mouth daily. Start taking on: January 29, 2021   ProAir HFA 108 (90 Base) MCG/ACT inhaler Generic drug: albuterol   rosuvastatin 10 MG tablet Commonly known as:  CRESTOR TAKE 1 TABLET NIGHTLY   solifenacin 5 MG tablet Commonly known as: VESICARE Take 1 tablet (5 mg total) by mouth daily.   traMADol 50 MG tablet Commonly known as: ULTRAM Take by mouth.      Allergies  Allergen Reactions  . Demerol [Meperidine]   . Emetrol Nausea And Vomiting  . Lopressor  [Metoprolol Tartrate]     Other reaction(s): Unknown intolerance  . Nsaids     Other reaction(s): Other (See Comments) Causes elevation in BP  . Sulfa Antibiotics Rash    Follow-up Information    Duanne Guess, MD.   Specialty: General Surgery Contact information: [address] [place] [address] [phone]                The results of significant diagnostics from this hospitalization (including imaging, microbiology, ancillary and laboratory) are listed below for reference.    Significant Diagnostic Studies: CT ANGIO CHEST PE W OR WO CONTRAST  Result Date: 01/28/2021 CLINICAL DATA:  Chest pain. EXAM: CT ANGIOGRAPHY CHEST WITH CONTRAST TECHNIQUE: Multidetector CT imaging of the chest was performed using the standard protocol during bolus administration of intravenous contrast. Multiplanar CT image reconstructions and MIPs were obtained to evaluate the vascular anatomy. CONTRAST:  1mL OMNIPAQUE IOHEXOL 350 MG/ML SOLN COMPARISON:  October 17, 2019. FINDINGS: Cardiovascular: Satisfactory opacification of the pulmonary arteries to the segmental level. No evidence of pulmonary embolism. Mild cardiomegaly is noted. Status post coronary bypass graft. No pericardial effusion. Mediastinum/Nodes: Mild sliding-type hiatal hernia is noted. Thyroid gland is unremarkable. No adenopathy is noted. Lungs/Pleura: No pneumothorax or pleural effusion is noted. Calcified granuloma is noted in right posterior costophrenic sulcus. No acute pulmonary abnormality is noted. Upper Abdomen: No acute abnormality. Musculoskeletal: No chest wall abnormality. No acute or significant  osseous findings. Review of the MIP images confirms the above findings. IMPRESSION: No definite evidence of pulmonary embolus. Mild sliding-type hiatal hernia. Status post coronary bypass graft. Aortic Atherosclerosis (ICD10-I70.0). Electronically Signed   By: Lupita Raider M.D.   On: 01/28/2021 10:52   NM Hepatobiliary Liver Func  Result Date: 01/27/2021 CLINICAL DATA:  Thickened gallbladder wall on recent ultrasound EXAM: NUCLEAR MEDICINE HEPATOBILIARY IMAGING TECHNIQUE: Sequential images of the abdomen were obtained out to 60 minutes following intravenous administration of radiopharmaceutical. RADIOPHARMACEUTICALS:  7.190 mCi Tc-48m  Choletec IV COMPARISON:  Ultrasound right upper quadrant January 26, 2021 FINDINGS: Prompt uptake and biliary excretion of activity by the liver is seen. Gallbladder activity is visualized, consistent with patency of cystic duct. Biliary activity passes into small bowel, consistent with patent common bile duct. IMPRESSION: Study within normal limits. Electronically Signed   By: Bretta Bang III M.D.   On: 01/27/2021 15:44   US Abdomen Limited RUQ (LIVER/GB)  Result Date: 01/26/2021 CLINICAL DATA:  Three days of right upper quadrant pain. EXAM: ULTRASOUND ABDOMEN LIMITED RIGHT UPPER QUADRANT COMPARISON:  None. FINDINGS: Gallbladder: No gallstones, biliary sludge or pericholecystic fluid visualized. Gallbladder is slightly decompressed with mild wall thickening measuring up to 3.6 mm. Positive sonographic Eulah Pont  sign noted by sonographer. Common bile duct: Diameter: 5 mm Liver: No focal lesion identified. Mildly increased parenchymal echogenicity. Portal vein is patent on color Doppler imaging with normal direction of blood flow towards the liver. Other: None. IMPRESSION: 1. No gallstones or biliary sludge visualized. Mild gallbladder wall thickening is nonspecific and can be seen in the setting of cholecystitis, liver disease, or heart failure. Consider further evaluation  with nuclear medicine HIDA scan to assess cystic duct patency. 2. Mildly increased parenchymal echogenicity of the liver, nonspecific, but is most commonly seen with fatty infiltration. There are no focal liver lesions identified. Electronically Signed   By: Maudry MayhewJeffrey  Waltz MD   On: 01/26/2021 20:03    Microbiology: Recent Results (from the past 240 hour(s))  Urine Culture     Status: Abnormal   Collection Time: 01/26/21  5:57 PM   Specimen: Urine, Random  Result Value Ref Range Status   Specimen Description   Final    URINE, RANDOM Performed at [facility], [address] [place], [place] [address]    Special Requests   Final    NONE Performed at [facility], [address] [place], [place] [address]    Culture MULTIPLE SPECIES PRESENT, SUGGEST RECOLLECTION (A)  Final   Report Status 01/28/2021 FINAL  Final  Resp Panel by RT-PCR (Flu A&B, Covid) Nasopharyngeal Swab     Status: None   Collection Time: 01/27/21  3:10 AM   Specimen: Nasopharyngeal Swab; Nasopharyngeal(NP) swabs in vial transport medium  Result Value Ref Range Status   SARS Coronavirus 2 by RT PCR NEGATIVE NEGATIVE Final    Comment: (NOTE) SARS-CoV-2 target nucleic acids are NOT DETECTED.  The SARS-CoV-2 RNA is generally detectable in upper respiratory specimens during the acute phase of infection. The lowest concentration of SARS-CoV-2 viral copies this assay can detect is 138 copies/mL. A negative result does not preclude SARS-Cov-2 infection and should not be used as the sole basis for treatment or other patient management decisions. A negative result may occur with  improper specimen collection/handling, submission of specimen other than nasopharyngeal swab, presence of viral mutation(s) within the areas targeted by this assay, and inadequate number of viral copies(<138 copies/mL). A negative result must be combined with clinical observations, patient history, and  epidemiological information. The expected result is Negative.  Fact Sheet for Patients:  BloggerCourse.comhttps://www.fda.gov/media/152166/download  Fact Sheet for Healthcare Providers:  SeriousBroker.ithttps://www.fda.gov/media/152162/download  This test is no t yet approved or cleared by the [place] FDA and  has been authorized for detection and/or diagnosis of SARS-CoV-2 by FDA under an Emergency Use Authorization (EUA). This EUA will remain  in effect (meaning this test can be used) for the duration of the COVID-19 declaration under Section 564(b)(1) of the Act, 21 U.S.C.section 360bbb-3(b)(1), unless the authorization is terminated  or revoked sooner.       Influenza A by PCR NEGATIVE NEGATIVE Final   Influenza B by PCR NEGATIVE NEGATIVE Final    Comment: (NOTE) The Xpert Xpress SARS-CoV-2/FLU/RSV plus assay is intended as an aid in the diagnosis of influenza from Nasopharyngeal swab specimens and should not be used as a sole basis for treatment. Nasal washings and aspirates are unacceptable for Xpert Xpress SARS-CoV-2/FLU/RSV testing.  Fact Sheet for Patients: BloggerCourse.comhttps://www.fda.gov/media/152166/download  Fact Sheet for Healthcare Providers: SeriousBroker.ithttps://www.fda.gov/media/152162/download  This test is not yet approved or cleared by the [place] FDA and has been authorized for detection and/or diagnosis of SARS-CoV-2 by FDA under an Emergency Use Authorization (EUA).  This EUA will remain in effect (meaning this test can be used) for the duration of the COVID-19 declaration under Section 564(b)(1) of the Act, 21 U.S.C. section 360bbb-3(b)(1), unless the authorization is terminated or revoked.  Performed at [facility], [address] [place], [place] [address]      Labs: Basic Metabolic Panel: Recent Labs  Lab 01/26/21 1757 01/27/21 0526 01/28/21 0845  NA 138 140 139  K 4.3 4.7 4.2  CL 104 104 104  CO2 25 28 27   GLUCOSE 205* 154* 131*  BUN 17 14 10   CREATININE 1.02* 1.00  0.92  CALCIUM 8.9 9.2 8.6*   Liver Function Tests: Recent Labs  Lab 01/26/21 1757 01/27/21 0526 01/28/21 0845  AST 30 23 21   ALT 20 17 13   ALKPHOS 95 87 79  BILITOT 0.8 0.7 0.6  PROT 7.7 7.3 6.9  ALBUMIN 3.6 3.4* 3.1*   Recent Labs  Lab 01/26/21 1757  LIPASE 36   No results for input(s): AMMONIA in the last 168 hours. CBC: Recent Labs  Lab 01/26/21 1757 01/27/21 0526 01/28/21 0845  WBC 11.3* 14.4* 10.4  HGB 12.7 12.2 12.0  HCT 41.1 39.7 39.5  MCV 89.7 89.8 91.4  PLT 245 227 217   Cardiac Enzymes: No results for input(s): CKTOTAL, CKMB, CKMBINDEX, TROPONINI in the last 168 hours. BNP: BNP (last 3 results) No results for input(s): BNP in the last 8760 hours.  ProBNP (last 3 results) No results for input(s): PROBNP in the last 8760 hours.  CBG: Recent Labs  Lab 01/27/21 1238 01/27/21 1624 01/27/21 2117 01/28/21 0727 01/28/21 1207  GLUCAP 136* 146* 182* 112* 139*       Signed:  01/29/21 MD.  [facility] 01/28/2021, 1:50 PM

## 2021-01-28 NOTE — Progress Notes (Addendum)
[facility] SURGICAL PROGRESS NOTE (cpt [id number])  Hospital Day(s): 1.   Interval History: Patient seen and examined, no acute events or new complaints overnight. Patient reports she is overall feeling better this morning. Her abdominal pain is essentially resolved. Now appears to be only over her right lower ribs. She denied any fever, chills, nausea, emesis. Her previous leukocytosis is resolved, now 10.4K. she is maintaining her renal function and liver function are normal. She underwent HIDA scan yesterday which was normal. She is tolerating CLD.   Review of Systems:  Constitutional: denies fever, chills  HEENT: denies cough or congestion  Respiratory: denies any shortness of breath  Cardiovascular: denies chest pain or palpitations  Gastrointestinal: denies abdominal pain, N/V, or diarrhea/and bowel function as per interval history Genitourinary: denies burning with urination or urinary frequency   Vital signs in last 24 hours: [min-max] current  Temp:  [97.7 F (36.5 C)-98.6 F (37 C)] 98.1 F (36.7 C) (03/30 0726) Pulse Rate:  [70-90] 70 (03/30 0726) Resp:  [16-19] 17 (03/30 0726) BP: (116-156)/(56-89) 116/56 (03/30 0726) SpO2:  [88 %-99 %] 99 % (03/30 0726)     Height: 5' 4.8" (164.6 cm) Weight: 102.1 kg BMI (Calculated): 37.67   Intake/Output last 2 shifts:  03/29 0701 - 03/30 0700 In: 1507.4 [I.V.:1480.3; IV Piggyback:27.1] Out: 500 [Urine:500]   Physical Exam:  Constitutional: alert, cooperative and no distress  HENT: normocephalic without obvious abnormality  Eyes: PERRL, EOM's grossly intact and symmetric  Respiratory: breathing slightly labored, baseline, on [place] Cardiovascular: regular rate and sinus rhythm  Chest: she does appear to be more tender over her left lower ribs rather than her abdomen Gastrointestinal: Soft, non-tender, non-distended, no rebound/guarding Musculoskeletal: no edema or wounds, motor and sensation grossly intact, NT     Labs:  CBC Latest Ref Rng & Units 01/27/2021 01/26/2021 10/19/2014  WBC 4.0 - 10.5 K/uL 14.4(H) 11.3(H) 10.1  Hemoglobin 12.0 - 15.0 g/dL 62.8 36.6 29.4  Hematocrit 36.0 - 46.0 % 39.7 41.1 40.2  Platelets 150 - 400 K/uL 227 245 221   CMP Latest Ref Rng & Units 01/27/2021 01/26/2021 10/19/2014  Glucose 70 - 99 mg/dL 765(Y) 650(P) 91  BUN 8 - 23 mg/dL 14 17 18   Creatinine 0.44 - 1.00 mg/dL 5.46) 5.68(L  Sodium 135 - 145 mmol/L 140 138 142  Potassium 3.5 - 5.1 mmol/L 4.7 4.3 4.1  Chloride 98 - 111 mmol/L 104 104 109(H)  CO2 22 - 32 mmol/L 28 25 25   Calcium 8.9 - 10.3 mg/dL 9.2 8.9 8.6  Total Protein 6.5 - 8.1 g/dL 7.3 7.7 7.6  Total Bilirubin 0.3 - 1.2 mg/dL 0.7 0.8 0.5  Alkaline Phos 38 - 126 U/L 87 95 69  AST 15 - 41 U/L 23 30 37  ALT 0 - 44 U/L 17 20 34    Imaging studies: No new pertinent imaging studies   Assessment/Plan: (ICD-10's: R10.11) 87 y.o. female admitted with RUQ abdominal pain thought to have acalculous cholecystitis however HIDA scan was normal and her pain/labs are improved this morning. Instead, she does appear to have more tenderness over her left lower ribs rather than her abdomen.    - I do not suspect she has acalculous cholecystitis given her clinical improvement and negative HIDA scan. Given this, I do not think she warrants any interventions from a surgical standpoint.     - Okay to advance diet as tolerated    - IVF support             -  Continue IV Abx (ceftriaxone)             - Monitor abdominal examination               - Pain control prn; antiemetics prn    - Further management per primary service   - General surgery will sign off but be available  All of the above findings and recommendations were discussed with the patient, patient's family (daughter at bedside), and the medical team, and all of patient's and family's questions were answered to her expressed satisfaction.  -- Lynden Oxford, PA-C  [facility] 01/28/2021,  8:20 AM [phone] M-F: 7am - 4pm  I saw and evaluated the patient.  I agree with the above documentation, exam, and plan, which I have edited where appropriate. Duanne Guess  12:27 PM

## 2021-02-19 ENCOUNTER — Ambulatory Visit: Payer: Self-pay | Admitting: General Surgery

## 2021-08-07 ENCOUNTER — Encounter: Payer: Self-pay | Admitting: General Surgery

## 2023-08-02 DEATH — deceased
